# Patient Record
Sex: Female | Born: 1985 | Race: Black or African American | Hispanic: No | Marital: Single | Smoking: Current every day smoker
Health system: Southern US, Community
[De-identification: ages and names within clinical notes are randomized; demographics above are authoritative.]

## PROBLEM LIST (undated history)

## (undated) ENCOUNTER — Inpatient Hospital Stay (HOSPITAL_COMMUNITY): Payer: Self-pay

## (undated) DIAGNOSIS — F32A Depression, unspecified: Secondary | ICD-10-CM

## (undated) DIAGNOSIS — F329 Major depressive disorder, single episode, unspecified: Secondary | ICD-10-CM

## (undated) DIAGNOSIS — F99 Mental disorder, not otherwise specified: Secondary | ICD-10-CM

## (undated) DIAGNOSIS — F1021 Alcohol dependence, in remission: Secondary | ICD-10-CM

## (undated) DIAGNOSIS — J45909 Unspecified asthma, uncomplicated: Secondary | ICD-10-CM

## (undated) DIAGNOSIS — F419 Anxiety disorder, unspecified: Secondary | ICD-10-CM

## (undated) HISTORY — PX: WRIST SURGERY: SHX841

## (undated) HISTORY — DX: Depression, unspecified: F32.A

## (undated) HISTORY — DX: Anxiety disorder, unspecified: F41.9

## (undated) HISTORY — PX: NASAL RECONSTRUCTION: SHX2069

## (undated) HISTORY — DX: Major depressive disorder, single episode, unspecified: F32.9

## (undated) HISTORY — DX: Mental disorder, not otherwise specified: F99

---
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
## 2006-02-13 ENCOUNTER — Emergency Department (HOSPITAL_COMMUNITY): Admission: EM | Admit: 2006-02-13 | Discharge: 2006-02-13 | Payer: Self-pay | Admitting: Emergency Medicine

## 2006-12-13 ENCOUNTER — Inpatient Hospital Stay (HOSPITAL_COMMUNITY): Admission: AD | Admit: 2006-12-13 | Discharge: 2006-12-13 | Payer: Self-pay | Admitting: Obstetrics and Gynecology

## 2006-12-15 ENCOUNTER — Inpatient Hospital Stay (HOSPITAL_COMMUNITY): Admission: AD | Admit: 2006-12-15 | Discharge: 2006-12-19 | Payer: Self-pay | Admitting: Obstetrics and Gynecology

## 2007-03-28 ENCOUNTER — Inpatient Hospital Stay (HOSPITAL_COMMUNITY): Admission: AD | Admit: 2007-03-28 | Discharge: 2007-03-28 | Payer: Self-pay | Admitting: Obstetrics and Gynecology

## 2007-03-31 ENCOUNTER — Emergency Department (HOSPITAL_COMMUNITY): Admission: EM | Admit: 2007-03-31 | Discharge: 2007-03-31 | Payer: Self-pay | Admitting: Emergency Medicine

## 2007-07-01 ENCOUNTER — Emergency Department (HOSPITAL_COMMUNITY): Admission: EM | Admit: 2007-07-01 | Discharge: 2007-07-01 | Payer: Self-pay | Admitting: Emergency Medicine

## 2007-07-07 ENCOUNTER — Emergency Department (HOSPITAL_COMMUNITY): Admission: EM | Admit: 2007-07-07 | Discharge: 2007-07-08 | Payer: Self-pay | Admitting: Emergency Medicine

## 2007-08-30 ENCOUNTER — Emergency Department (HOSPITAL_COMMUNITY): Admission: EM | Admit: 2007-08-30 | Discharge: 2007-08-30 | Payer: Self-pay | Admitting: Emergency Medicine

## 2010-12-06 ENCOUNTER — Emergency Department (HOSPITAL_COMMUNITY): Payer: Medicaid Other

## 2010-12-06 ENCOUNTER — Emergency Department (HOSPITAL_COMMUNITY)
Admission: EM | Admit: 2010-12-06 | Discharge: 2010-12-06 | Disposition: A | Discharge status: Home / Self Care | Payer: Medicaid Other | Attending: Emergency Medicine | Admitting: Emergency Medicine

## 2010-12-06 DIAGNOSIS — I10 Essential (primary) hypertension: Secondary | ICD-10-CM | POA: Insufficient documentation

## 2010-12-06 DIAGNOSIS — J45909 Unspecified asthma, uncomplicated: Secondary | ICD-10-CM | POA: Insufficient documentation

## 2010-12-06 DIAGNOSIS — F1010 Alcohol abuse, uncomplicated: Secondary | ICD-10-CM | POA: Insufficient documentation

## 2010-12-06 DIAGNOSIS — W268XXA Contact with other sharp object(s), not elsewhere classified, initial encounter: Secondary | ICD-10-CM | POA: Insufficient documentation

## 2010-12-06 DIAGNOSIS — S66909A Unspecified injury of unspecified muscle, fascia and tendon at wrist and hand level, unspecified hand, initial encounter: Secondary | ICD-10-CM | POA: Insufficient documentation

## 2010-12-06 DIAGNOSIS — S61509A Unspecified open wound of unspecified wrist, initial encounter: Secondary | ICD-10-CM | POA: Insufficient documentation

## 2010-12-06 DIAGNOSIS — E119 Type 2 diabetes mellitus without complications: Secondary | ICD-10-CM | POA: Insufficient documentation

## 2010-12-12 ENCOUNTER — Ambulatory Visit: Payer: Medicaid Other | Attending: Orthopedic Surgery | Admitting: Occupational Therapy

## 2010-12-12 DIAGNOSIS — IMO0001 Reserved for inherently not codable concepts without codable children: Secondary | ICD-10-CM | POA: Insufficient documentation

## 2010-12-12 DIAGNOSIS — R609 Edema, unspecified: Secondary | ICD-10-CM | POA: Insufficient documentation

## 2010-12-12 DIAGNOSIS — M25549 Pain in joints of unspecified hand: Secondary | ICD-10-CM | POA: Insufficient documentation

## 2010-12-25 ENCOUNTER — Encounter: Payer: Medicaid Other | Admitting: Occupational Therapy

## 2011-03-08 NOTE — Op Note (Signed)
NAME:  Kimberly Rosales, Kimberly Rosales                 ACCOUNT NO.:  [id number]   MEDICAL RECORD NO.:  [id number]          PATIENT TYPE:  INP   LOCATION:  9198                          FACILITY:  [facility]   PHYSICIAN:  Zenaida Niece, M.D.DATE OF BIRTH:  06-27-1986   DATE OF PROCEDURE:  12/15/2006  DATE OF DISCHARGE:                               OPERATIVE REPORT   PREOPERATIVE DIAGNOSIS:  1. Intrauterine pregnancy at 37 weeks.  2. Previous cesarean section x2.  3. In labor.   POSTOPERATIVE DIAGNOSES:  1. Intrauterine pregnancy at 37 weeks.  2. Previous cesarean section x2,  3. In labor.   PROCEDURES:  Repeat low transverse cesarean section.   SURGEON:  Zenaida Niece, M.D.   ASSISTANT:  None.   ANESTHESIA:  Spinal.   SPECIMENS:  Placenta sent to labor and delivery.   ESTIMATED BLOOD LOSS:  800 mL.   COMPLICATIONS:  None.   FINDINGS:  The patient had normal anatomy and delivered a viable female  infant, with Apgars of 9and 9.  Weight 5 pounds 10 ounces.   PROCEDURE IN DETAIL:  The patient was taken to the operating room and  placed in the sitting position.  Dr. Malen Gauze instilled spinal anesthesia  and she was placed in the dorsal supine position with a left lateral  tilt.  Her abdomen was prepped and draped in the usual sterile fashion,  and a Foley catheter was inserted.  The level of her anesthesia was  found to be adequate and her abdomen was entered via a standard  Pfannenstiel incision at the site of her previous scar.  Once the  peritoneal cavity was entered a disposable self-retaining retractor was  placed to expose the lower uterine segment. A 4 cm transverse incision  was made in the lower uterine segment, pushing the bladder inferior.  Membranes were ruptured, revealing clear fluid.  The incision was  extended bilaterally digitally.  The fetal vertex was grasped and  delivered through the incision atraumatically.  A loose nuchal cord x1  was reduced, and mouth and nares were  suctioned.  The remainder of the  infant then delivered atraumatically.  The cord was doubly clamped and  cut, and the infant handed to the awaiting pediatric team.  Cord blood  was obtained and the placenta delivered spontaneously.  Uterus was wiped  dry with a clean lap pad, and all membranes and debris were removed.  Uterine incision was inspected and found to be free of extensions.  Uterine incision was then closed in one layer, being a running, locking  layer with #1 chromic; with adequate hemostasis.  Tubes and ovaries were  inspected and found to be normal.  Uterine incision was again inspected  and found to be hemostatic.  Bleeding from serosal edges was controlled  with electrocautery.  Subfascial space was then irrigated and made  hemostatic with electrocautery.  Fascia was closed in a running fashion,  starting at both ends and meeting in the middle with 0 Vicryl.  Subcutaneous tissue was then irrigated and made hemostatic with  electrocautery.  Skin was then closed with  staples, followed by a  sterile dressing.  The patient tolerated the procedure well and was  taken to the recovery room in stable condition.  Counts were correct x2.  She received Ancef 1 gram IV after cord clamp.      Zenaida Niece, M.D.  Electronically Signed     TDM/MEDQ  D:  12/15/2006  T:  12/15/2006  Job:  [id number]

## 2011-03-08 NOTE — H&P (Signed)
NAME:  Kimberly Rosales, SHIMIZU NO.:  [id number]   MEDICAL RECORD NO.:  [id number]          PATIENT TYPE:  INP   LOCATION:  NA                            [facility]ACILITY:  [facility]   PHYSICIAN:  Huel Cote, M.D. DATE OF BIRTH:  1986/01/16   DATE OF ADMISSION:  06/18/2005  DATE OF DISCHARGE:                                HISTORY & PHYSICAL   The patient is a 25 year old, G2, P1-0-0-1, who is coming in for a scheduled  cesarean section at 39+ weeks gestation.  This is being performed as a  scheduled repeat cesarean section as the patient declined VBAC and has had a  previous cesarean section for failure to progress.  The patient's estimated  due date is June 21, 2005, by an 11 week scan.  She also had a 19 week  anatomy scan.  The patient's prenatal care has been complicated by extremely  scanty prenatal care.  She began her prenatal care in [facility], where  she had approximately 3 obstetrical visits and then was not seen again until  she came to our office at [redacted] weeks gestation.  At that point, the patient  was counseled as to her options regarding cesarean section and VBAC  delivery, and she states that she did not desire a VBAC under any  circumstances.  Her other medical history was significant for sickle cell  trait.  The father of the baby has not been tested, as they have no finances  for that test, and they understand there is a 25% risk of sickle cell anemia  to the baby if he was also a carrier.  She is also a smoker and was advised  to discontinue smoking.  The patient was seen in our office for  approximately 3 visits and was not compliant with her appointments.  In  fact, her previously scheduled cesarean section had to be cancelled because  of her noncompliance with visits and rescheduled until she was seen in the  office one additional time.  At that visit, the patient was normotensive  with a blood pressure of 140/70 and had no complaints.   Her  prenatal labs are as follows:  A positive, antibody negative; Rubella  immune; RPR nonreactive hepatitis B surface antigen negative; GC negative;  Chlamydia negative; HIV negative; she was positive for sickle trait as  stated.  She never had a group B strep performed given her noncompliance  with visits and her intention to not labor vaginally.   PAST MEDICAL HISTORY:  Significant for history of asthma which is stable  with only periodic need of albuterol inhaler.   PAST SURGICAL HISTORY:  C-section in 2005.   PAST OBSTETRICAL HISTORY:  In September 2005, she had a 6 pound 1 ounce  infant by cesarean section at [facility].   PAST GYN HISTORY:  She denies any history of problems with Pap smears or  STDs.   Currently, she is on no medications.  She is a smoker of a pack every 3-4  days.  She denies any drug allergies.   PHYSICAL EXAMINATION:  VITAL SIGNS:  Her weight is 144 pounds, blood  pressure is 140/70.  CARDIAC:  Regular rate and rhythm.  LUNGS:  Clear.  ABDOMEN:  Soft, gravid, and nontender.  Fetal heart rate is normal at 140.  She had a reactive NST prior to surgery on June 14, 2005.  CERVICAL EXAM:  50 and 1 at a -3 and vertex.   The patient was again counseled as to the risks and benefits of proceeding  with cesarean section including bleeding and infection and possible damage  to bowel and bladder.  The patient understands these risks and desires to  proceed with the surgery as stated.  She declines attempt at vaginal birth  after cesarean section and would not labor even if she went into labor prior  to cesarean section.  Her surgery has been rescheduled for Tuesday, the 29th  at noon at [facility] facility.      Huel Cote, M.D.  Electronically Signed     KR/MEDQ  D:  06/14/2005  T:  06/14/2005  Job:  [id number]

## 2011-03-08 NOTE — Discharge Summary (Signed)
Kimberly Rosales, Kimberly Rosales                 ACCOUNT NO.:  [id number]   MEDICAL RECORD NO.:  [id number]          PATIENT TYPE:  INP   LOCATION:  9115                          [facility]ACILITY:  [facility]   PHYSICIAN:  Huel Cote, M.D. DATE OF BIRTH:  Sep 20, 1986   DATE OF ADMISSION:  06/16/2005  DATE OF DISCHARGE:  06/20/2005                                 DISCHARGE SUMMARY   DISCHARGE DIAGNOSES:  1.  Term pregnancy at 39 plus weeks delivered.  2.  Status post repeat low transverse cesarean section   DISCHARGE FOLLOWUP:  The patient is to follow up in the office in two weeks  for an incision check and again in six weeks for a full exam.   DISCHARGE MEDICATIONS:  1.  Motrin 600 mg p.o. over six hours.  2.  Percocet one to two tablets p.o. every four hours p.r.n.   HOSPITAL COURSE:  The patient is a 25 year old G2 P 1-0-0-1 who came in in  labor on June 16, 2005 at 59 plus weeks gestation for a repeat cesarean  section.  Her prenatal care had been remarkable for scanty office visits.  She was seen for probably a total of five to six visits the entire pregnancy  and also the patient was a smoker. She had declined vaginal birth after  cesarean and had been scheduled for cesarean section which had to be  postponed due to her scanty prenatal care. However, came in in labor prior  to the rescheduled date. Prenatal labs are as follows:  A+ antibody  negative, rubella immune, RPR nonreactive, hepatitis B surface antigen  negative, GC negative, chlamydia negative, HIV negative, sickle trait  positive and group B strep had never been done.  For the remainder of her  history and physical, please see her dictated history and physical.   Upon admission, briefly she underwent a repeat low transverse cesarean  section without difficulty and was delivered of a viable female infant, weight  was 5 pounds 11 ounces, Apgars were 8 and 9.  She was then admitted for  routine postoperative care and all was well.  Her  postoperative hemoglobin  remained stable on two checks at 10.8.  On postoperative day #4, she was  doing well, tolerating a regular diet, voiding and having normal flatus. Her  fundus was firm. Her incision was clear. Staples were removed and Steri-  Strips placed and she was felt stable for discharge home. Therefore, she was  discharged with follow-up in two weeks for an incision check.      Huel Cote, M.D.  Electronically Signed     KR/MEDQ  D:  06/20/2005  T:  06/20/2005  Job:  [id number]

## 2011-03-08 NOTE — Discharge Summary (Signed)
NAMELEEONA, MCCARDLE                 ACCOUNT NO.:  [id number]   MEDICAL RECORD NO.:  [id number]          PATIENT TYPE:  INP   LOCATION:  9104                          [facility]ACILITY:  [facility]   PHYSICIAN:  Zenaida Niece, M.D.DATE OF BIRTH:  [birth year]   DATE OF ADMISSION:  12/15/2006  DATE OF DISCHARGE:  12/19/2006                               DISCHARGE SUMMARY   ADMISSION AND DISCHARGE DIAGNOSES:  1. Intrauterine pregnancy at 37 weeks.  2. Previous cesarean section x2.  3. In labor.   PROCEDURES:  On February 25, she had a repeat low transverse cesarean  section.   HISTORY AND PHYSICAL:  This is a 25 year old black female, gravida 3,  para 2-0-0-2, with an EGA of [redacted] weeks, who presents with regular  contractions.  Evaluation in Triage revealed her to be 3-cm dilated with  regular contractions.  Prenatal care was complicated by late care at  approximately 33 weeks and 2 prior cesarean sections, for which she is  scheduled for a repeat cesarean section.  Please see chart for full  history and physical.  History significant for 2 cesarean section,  asthma, sickle cell trait and depression.   PRENATAL LABORATORY DATA:  Blood type is A positive with a negative  antibody screen, rubella immune, hepatitis B surface antigen negative,  RPR nonreactive, HIV negative, gonorrhea and Chlamydia negative.   PHYSICAL EXAM:  Significant for benign abdomen with well-healed  transverse scar and she is gravid with a fundal height appropriate for  gestational age.  Again, cervix per the nurse is 3, 50 and -2.   HOSPITAL COURSE:  The patient was admitted and taken the operating room  and underwent repeat low transverse cesarean section.  She delivered a  viable female infant with Apgars of 9 and 9 that weighed 5 pounds 14  ounces.  Postoperatively, she had no significant complications except  for depression, for which Dr. Senaida Ores started her on Celexa.  Pre-  delivery hemoglobin was 11.5, postdelivery  9.6.  On postoperative #4,  she was felt to be stable enough for discharge home.  Her staples  removed and Steri-Strips applied.  There was a slight overlap of her  incision on the left side and this did slightly separate with removal of  the staples.   DISCHARGE INSTRUCTIONS:  Regular diet, pelvic rest, no strenuous  activity.   FOLLOWUP:  In 2 weeks for incision check.   MEDICATIONS:  1. Darvocet-N 100, #40, one to two p.o. q.4-6 h. p.r.n. pain.  2. Celexa 20 mg p.o. daily.   She was given our discharge pamphlet.      Zenaida Niece, M.D.  Electronically Signed     TDM/MEDQ  D:  12/19/2006  T:  12/19/2006  Job:  [id number]

## 2011-03-08 NOTE — Op Note (Signed)
Kimberly Rosales, Kimberly Rosales                 ACCOUNT NO.:  [id number]   MEDICAL RECORD NO.:  [id number]          PATIENT TYPE:  INP   LOCATION:  9115                          FACILITY:  [facility]   PHYSICIAN:  Huel Cote, M.D. DATE OF BIRTH:  Nov 26, 1985   DATE OF PROCEDURE:  06/16/2005  DATE OF DISCHARGE:                                 OPERATIVE REPORT   PREOPERATIVE DIAGNOSIS:  1.  Term pregnancy at 39+ weeks.  2.  Active labor.  3.  Previous C-section declines vaginal birth after cesarean section.   POSTOPERATIVE DIAGNOSIS:  1.  Term pregnancy at 39+ weeks.  2.  Active labor.  3.  Previous C-section declines vaginal birth after cesarean section.   PROCEDURE:  Repeat low transverse cesarean section with two-layer closure.   SURGEON:  Dr. Huel Cote.   ANESTHESIA:  Spinal.   SPECIMENS SENT:  Placenta.   ESTIMATED BLOOD LOSS:  700 mL.   URINE OUTPUT:  100 mL clear urine.   IV FLUIDS:  2500 mL LR.   FINDINGS:  There is a viable female infant in vertex presentation. Apgars were  08/09 weight 5 pounds 11 ounces. Normal uterus, tubes and ovaries were  noted.   PROCEDURE:  The patient was taken to operating room where spinal anesthesia  was obtained without difficulty. She was then prepped and draped in normal  sterile fashion in dorsal supine position with a leftward tilt. The  Pfannenstiel skin incision was then made through a preexisting scar and  carried through to underlying layer of fascia both by sharp dissection and  Bovie cautery. The fascia was then nicked in the midline and the incision  was extended laterally with Mayo scissors. The inferior aspect of the  incision was then grasped with Kocher clamps, elevated and dissected off the  underlying rectus muscles. Superior aspect was likewise dissected off the  rectus muscles. Rectus muscles were separated in midline and the peritoneal  cavity entered bluntly. Peritoneal incision was extended both superiorly and  inferiorly with careful attention to avoid both bowel  and bladder. The  wound retractor large size was then placed within the incision and utilized  for skin retraction with excellent result. The lower uterine segment was  thus excellently exposed and the lower uterine segment was incised in  transverse fashion. The uterine cavity itself was entered bluntly and  extended digitally. The infant's head was then delivered atraumatically and  bulb suctioned and the remainder of the body delivered without difficulty.  Cord clamped, cut and the infant was handed off to awaiting pediatricians.  The placenta was then delivered after cord blood was obtained and the uterus  cleared of all clots, debris with moist lap sponge. The uterine incision was  then closed in two layers with 0 chromic in a running locked fashion on the  first layer and second layer imbricating suture of the same. The right angle  did require additional suture figure-of-eight for hemostasis of the chromic.  At this point the incision appeared hemostatic with the gutters were cleared  of all clots and debris and any active  bleeding was closely monitored. There  was no bleeding noted. Therefore all instruments and sponges were removed  from the patient's abdomen. The wound retractor was removed. The rectus  muscles were reapproximated with several interrupted sutures of 0 chromic.  The fascia was closed with 0 Vicryl in a running fashion and skin was closed  with staples. Sponge, lap and needle counts were correct x2 and the patient  was taken to the recovery room in stable condition.      Huel Cote, M.D.  Electronically Signed     KR/MEDQ  D:  06/16/2005  T:  06/16/2005  Job:  [id number]

## 2011-07-22 ENCOUNTER — Ambulatory Visit (HOSPITAL_BASED_OUTPATIENT_CLINIC_OR_DEPARTMENT_OTHER)
Admission: RE | Admit: 2011-07-22 | Discharge: 2011-07-22 | Disposition: A | Discharge status: Home / Self Care | Payer: Medicaid Other | Source: Ambulatory Visit | Attending: Orthopedic Surgery | Admitting: Orthopedic Surgery

## 2011-07-22 ENCOUNTER — Other Ambulatory Visit: Payer: Self-pay | Admitting: Orthopedic Surgery

## 2011-07-22 DIAGNOSIS — S5410XA Injury of median nerve at forearm level, unspecified arm, initial encounter: Secondary | ICD-10-CM | POA: Insufficient documentation

## 2011-07-22 DIAGNOSIS — X58XXXA Exposure to other specified factors, initial encounter: Secondary | ICD-10-CM | POA: Insufficient documentation

## 2011-07-22 LAB — POCT HEMOGLOBIN-HEMACUE: Hemoglobin: 13.8 g/dL (ref 12.0–15.0)

## 2011-07-23 NOTE — Op Note (Signed)
  NAME:  Kimberly Rosales, Kimberly Rosales                 ACCOUNT NO.:  [id number]  MEDICAL RECORD NO.:  [id number]  LOCATION:                                 FACILITY:  PHYSICIAN:  Artist Pais. Emani Morad, M.D.DATE OF BIRTH:  1986/01/10  DATE OF PROCEDURE:  07/22/2011 DATE OF DISCHARGE:                              OPERATIVE REPORT   PREOPERATIVE DIAGNOSIS:  Laceration with median nerve repair, right wrist.  POSTOPERATIVE DIAGNOSIS:  Laceration with median nerve repair, right wrist.  PROCEDURE:  Exploration and excision of neuroma in continuity, right median nerve with secondary repair using Axoguard 1-mm nerve sheath wrap.  SURGEON:  Artist Pais. Mina Marble, MD  ASSISTANT:  None.  ANESTHESIA:  General.  TOURNIQUET TIME:  An hour and three minutes.  No complication.  No drains.  The patient was taken to the operating suite.  After induction of general anesthesia, right upper extremity was prepped and draped in sterile fashion.  An Esmarch was used to exsanguinate the limb. Tourniquet was inflated to 250 mmHg.  At this point in time, previous incision was used to get down to the area of the median nerve at the distal forearm and wrist crease.  There was a large neuroma in continuity that was carefully dissected out using loupe magnification. This included extending the wound distally into the carpal canal.  The carpal canal was released.  The median nerve was identified proximally, distally, and traced into a large area where the repair had been undertaken several months prior with a large neuroma in continuity. This was dissected out using the microscope and then carefully excised. There was a very small, less than 1-cm gap that was easily closed primarily with mobilization of nerve proximally and distally in slight flexion of the wrist.  This was undertaken using 9-0 nylon x7 sutures. Once this was done, Axoguard 1-cm wrap was then placed around the median nerve to cover proximal and distal.   The repair site was sutured with 9-0 nylon to encase the nerve under no undue tension.  The wound was then irrigated and loosely closed with 4-0 nylon.  Xeroform, 4 x 4's, and a dorsal extension block splint was applied.  The patient tolerated the procedure well and went to recovery in stable fashion.     Artist Pais Mina Marble, M.D.     MAW/MEDQ  D:  07/22/2011  T:  07/22/2011  Job:  [id number]  Electronically Signed by Dairl Ponder M.D. on 07/23/2011 04:05:38 PM

## 2011-07-30 LAB — POCT PREGNANCY, URINE
Operator id: 151321
Preg Test, Ur: NEGATIVE

## 2011-07-30 LAB — URINALYSIS, ROUTINE W REFLEX MICROSCOPIC
Glucose, UA: NEGATIVE
Specific Gravity, Urine: 1.021
Urobilinogen, UA: 0.2

## 2011-07-30 LAB — URINE MICROSCOPIC-ADD ON

## 2011-08-01 LAB — URINALYSIS, ROUTINE W REFLEX MICROSCOPIC
Glucose, UA: NEGATIVE
Protein, ur: NEGATIVE

## 2011-08-01 LAB — URINE MICROSCOPIC-ADD ON

## 2011-08-08 ENCOUNTER — Encounter: Payer: Medicaid Other | Admitting: Occupational Therapy

## 2011-08-08 LAB — CBC
HCT: 36.8
Platelets: 267
RDW: 14.6 — ABNORMAL HIGH

## 2011-08-08 LAB — DIFFERENTIAL
Basophils Absolute: 0
Eosinophils Relative: 5
Lymphocytes Relative: 19
Neutrophils Relative %: 71

## 2011-08-08 LAB — URINALYSIS, ROUTINE W REFLEX MICROSCOPIC
Bilirubin Urine: NEGATIVE
Bilirubin Urine: NEGATIVE
Ketones, ur: NEGATIVE
Ketones, ur: NEGATIVE
Leukocytes, UA: NEGATIVE
Nitrite: NEGATIVE
Nitrite: NEGATIVE
Protein, ur: NEGATIVE
Protein, ur: 30 — AB
Specific Gravity, Urine: 1.02
Urobilinogen, UA: 0.2
Urobilinogen, UA: 0.2

## 2011-08-08 LAB — URINE CULTURE: Colony Count: 6000

## 2011-08-08 LAB — URINE MICROSCOPIC-ADD ON

## 2011-08-08 LAB — I-STAT 8, (EC8 V) (CONVERTED LAB)
Acid-Base Excess: 1
Bicarbonate: 25.7 — ABNORMAL HIGH
Chloride: 107
HCT: 41
Operator id: 270651
pCO2, Ven: 41 — ABNORMAL LOW

## 2011-08-08 LAB — POCT I-STAT CREATININE: Operator id: 270651

## 2011-08-08 LAB — POCT PREGNANCY, URINE
Operator id: 246641
Preg Test, Ur: NEGATIVE

## 2013-03-05 ENCOUNTER — Telehealth (HOSPITAL_COMMUNITY): Payer: Self-pay | Admitting: *Deleted

## 2013-03-05 ENCOUNTER — Encounter (HOSPITAL_COMMUNITY): Payer: Self-pay | Admitting: *Deleted

## 2013-03-06 ENCOUNTER — Inpatient Hospital Stay (HOSPITAL_COMMUNITY)
Admission: AD | Admit: 2013-03-06 | Discharge: 2013-03-10 | DRG: 897 | Disposition: A | Discharge status: Home / Self Care | Payer: MEDICAID | Attending: Psychiatry | Admitting: Psychiatry

## 2013-03-06 ENCOUNTER — Encounter (HOSPITAL_COMMUNITY): Payer: Self-pay | Admitting: *Deleted

## 2013-03-06 DIAGNOSIS — F1010 Alcohol abuse, uncomplicated: Principal | ICD-10-CM | POA: Diagnosis present

## 2013-03-06 DIAGNOSIS — Z79899 Other long term (current) drug therapy: Secondary | ICD-10-CM

## 2013-03-06 DIAGNOSIS — F39 Unspecified mood [affective] disorder: Secondary | ICD-10-CM | POA: Diagnosis present

## 2013-03-06 DIAGNOSIS — F4310 Post-traumatic stress disorder, unspecified: Secondary | ICD-10-CM | POA: Diagnosis present

## 2013-03-06 MED ORDER — THIAMINE HCL 100 MG/ML IJ SOLN: 100.0000 mg | Freq: Once | INTRAMUSCULAR | Status: DC

## 2013-03-06 MED ORDER — DULOXETINE HCL 60 MG PO CPEP
60.0000 mg | ORAL_CAPSULE | Freq: Every day | ORAL | Status: DC
  Administered 2013-03-06 – 2013-03-10 (×5): 60 mg via ORAL
  Filled 2013-03-06 (×6): qty 1

## 2013-03-06 MED ORDER — ACETAMINOPHEN 325 MG PO TABS
650.0000 mg | ORAL_TABLET | Freq: Four times a day (QID) | ORAL | Status: DC | PRN
  Administered 2013-03-06 (×2): 650 mg via ORAL

## 2013-03-06 MED ORDER — CHLORDIAZEPOXIDE HCL 25 MG PO CAPS
25.0000 mg | ORAL_CAPSULE | Freq: Four times a day (QID) | ORAL | Status: AC | PRN
  Administered 2013-03-06 – 2013-03-07 (×4): 25 mg via ORAL
  Filled 2013-03-06 (×4): qty 1

## 2013-03-06 MED ORDER — ALUM & MAG HYDROXIDE-SIMETH 200-200-20 MG/5ML PO SUSP: 30.0000 mL | ORAL | Status: DC | PRN

## 2013-03-06 MED ORDER — NICOTINE 21 MG/24HR TD PT24
21.0000 mg | MEDICATED_PATCH | Freq: Every day | TRANSDERMAL | Status: DC
  Administered 2013-03-07 – 2013-03-10 (×4): 21 mg via TRANSDERMAL
  Filled 2013-03-06 (×6): qty 1

## 2013-03-06 MED ORDER — LAMOTRIGINE 25 MG PO TABS
25.0000 mg | ORAL_TABLET | Freq: Every day | ORAL | Status: DC
  Administered 2013-03-06 – 2013-03-10 (×5): 25 mg via ORAL
  Filled 2013-03-06 (×8): qty 1

## 2013-03-06 MED ORDER — ONDANSETRON 4 MG PO TBDP: 4.0000 mg | ORAL_TABLET | Freq: Four times a day (QID) | ORAL | Status: AC | PRN

## 2013-03-06 MED ORDER — HYDROXYZINE HCL 25 MG PO TABS
25.0000 mg | ORAL_TABLET | Freq: Four times a day (QID) | ORAL | Status: AC | PRN
  Administered 2013-03-06 – 2013-03-08 (×4): 25 mg via ORAL
  Filled 2013-03-06 (×3): qty 1

## 2013-03-06 MED ORDER — LOPERAMIDE HCL 2 MG PO CAPS: 2.0000 mg | ORAL_CAPSULE | ORAL | Status: AC | PRN

## 2013-03-06 MED ORDER — QUETIAPINE FUMARATE 25 MG PO TABS
25.0000 mg | ORAL_TABLET | Freq: Three times a day (TID) | ORAL | Status: DC | PRN
  Administered 2013-03-06 – 2013-03-07 (×5): 25 mg via ORAL
  Filled 2013-03-06 (×5): qty 1

## 2013-03-06 MED ORDER — TRAZODONE HCL 50 MG PO TABS
50.0000 mg | ORAL_TABLET | Freq: Every evening | ORAL | Status: DC | PRN
  Administered 2013-03-06: 50 mg via ORAL
  Filled 2013-03-06 (×2): qty 1

## 2013-03-06 MED ORDER — CHLORDIAZEPOXIDE HCL 25 MG PO CAPS
ORAL_CAPSULE | ORAL | Status: AC
  Filled 2013-03-06: qty 1

## 2013-03-06 MED ORDER — VITAMIN B-1 100 MG PO TABS
100.0000 mg | ORAL_TABLET | Freq: Every day | ORAL | Status: DC
  Administered 2013-03-06 – 2013-03-10 (×5): 100 mg via ORAL
  Filled 2013-03-06 (×6): qty 1

## 2013-03-06 MED ORDER — CHLORDIAZEPOXIDE HCL 25 MG PO CAPS
25.0000 mg | ORAL_CAPSULE | Freq: Once | ORAL | Status: AC
  Administered 2013-03-06: 25 mg via ORAL

## 2013-03-06 MED ORDER — ADULT MULTIVITAMIN W/MINERALS CH
1.0000 | ORAL_TABLET | Freq: Every day | ORAL | Status: DC
  Administered 2013-03-06 – 2013-03-10 (×5): 1 via ORAL
  Filled 2013-03-06 (×6): qty 1

## 2013-03-06 MED ORDER — MAGNESIUM HYDROXIDE 400 MG/5ML PO SUSP
30.0000 mL | Freq: Every day | ORAL | Status: DC | PRN
  Administered 2013-03-08: 30 mL via ORAL

## 2013-03-06 NOTE — Progress Notes (Signed)
Adult Psychoeducational Group Note  Date:  03/06/2013 Time:  4:10 PM  Group Topic/Focus:  Healthy Communication:   The focus of this group is to discuss communication, barriers to communication, as well as healthy ways to communicate with others.  Participation Level:  Did Not Attend  Participation Quality:    Affect:    Cognitive:    Insight:   Engagement in Group:    Modes of Intervention:    Additional Comments:  Pt was in the bed sleeping, pt refused to attend group.  Isla Pence M 03/06/2013, 4:10 PM

## 2013-03-06 NOTE — Progress Notes (Signed)
Psychoeducational Group Note  Date:  03/06/2013 Time:  1300  Group Topic/Focus:  Identifying Needs:   The focus of this group is to help patients identify their personal needs that have been historically problematic and identify healthy behaviors to address their needs.  Participation Level: Did Not Attend  Participation Quality:  Not Applicable  Affect:  Not Applicable  Cognitive:  Not Applicable  Insight:  Not Applicable  Engagement in Group: Not Applicable  Additional Comments:    Rich Brave 03/06/2013, 3:00 PM
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
## 2013-03-06 NOTE — Tx Team (Signed)
Initial Interdisciplinary Treatment Plan  PATIENT STRENGTHS: (choose at least two) Average or above average intelligence Motivation for treatment/growth Supportive family/friends  PATIENT STRESSORS: Financial difficulties Legal issue Substance abuse   PROBLEM LIST: Problem List/Patient Goals Date to be addressed Date deferred Reason deferred Estimated date of resolution  Depression 03/06/13     Anxiety 03/06/13     Suicidal Ideation 03/06/13                                          DISCHARGE CRITERIA:  Improved stabilization in mood, thinking, and/or behavior Motivation to continue treatment in a less acute level of care Need for constant or close observation no longer present Verbal commitment to aftercare and medication compliance Withdrawal symptoms are absent or subacute and managed without 24-hour nursing intervention  PRELIMINARY DISCHARGE PLAN: Outpatient therapy Return to previous living arrangement  PATIENT/FAMIILY INVOLVEMENT: This treatment plan has been presented to and reviewed with the patient, SARHA BARTELT.  The patient and family have been given the opportunity to ask questions and make suggestions.  Juliann Pares 03/06/2013, 4:26 AM

## 2013-03-06 NOTE — Progress Notes (Signed)
Patient did attend the evening speaker AA meeting.  

## 2013-03-06 NOTE — Progress Notes (Signed)
Psychoeducational Group Note  Date:  03/06/2013 Time:  0945 am  Group Topic/Focus:  Identifying Needs:   The focus of this group is to help patients identify their personal needs that have been historically problematic and identify healthy behaviors to address their needs.   Self inventory  Participation Level:  Active  Participation Quality:  Appropriate  Affect:  Appropriate  Cognitive:  Appropriate  Insight:  Developing/Improving  Engagement in Group:  Developing/Improving  Additional Comments:    Andrena Mews 03/06/2013,10:52 AM
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
## 2013-03-06 NOTE — Progress Notes (Signed)
D   Pt complains of anxiety but not related to withdrawal   She did attend group  She has a tendency to isolate and has limited interaction with others  She reported this morning that she was very depressed and hopes the doctor will put her on and antidepressant A   Verbal support given  Medications administered and effectiveness monitored   Q 15 min checks R   Pt safe at present

## 2013-03-07 MED ORDER — IBUPROFEN 800 MG PO TABS
800.0000 mg | ORAL_TABLET | Freq: Three times a day (TID) | ORAL | Status: DC | PRN
  Administered 2013-03-07 – 2013-03-10 (×6): 800 mg via ORAL
  Filled 2013-03-07 (×6): qty 1

## 2013-03-07 MED ORDER — TRAZODONE HCL 100 MG PO TABS
100.0000 mg | ORAL_TABLET | Freq: Every evening | ORAL | Status: DC | PRN
  Administered 2013-03-07 – 2013-03-08 (×4): 100 mg via ORAL
  Filled 2013-03-07: qty 1
  Filled 2013-03-07: qty 28
  Filled 2013-03-07 (×3): qty 1

## 2013-03-07 MED ORDER — GABAPENTIN 100 MG PO CAPS
100.0000 mg | ORAL_CAPSULE | Freq: Three times a day (TID) | ORAL | Status: DC
  Administered 2013-03-07 – 2013-03-08 (×3): 100 mg via ORAL
  Filled 2013-03-07 (×7): qty 1

## 2013-03-07 NOTE — Progress Notes (Signed)
D.  Pt anxious on approach, denies other complaints at this time.  Pt did not attend evening AA group, stayed in room in bed.  Denies SI/HI/hallucinations at this time.  Interacting appropriately with peers on the unit.  A.  Support and encouragement offered, medication given as ordered for anxiety and insomnia.  R.  Pt remains safe on unit, will continue to monitor.

## 2013-03-07 NOTE — Progress Notes (Signed)
Patient did not attend the evening speaker AA meeting. Pt remained in bed after being notified that meeting was beginning.

## 2013-03-07 NOTE — [facility] Group Notes (Addendum)
[facility] Group Notes:  (Clinical Social Work)  03/07/2013  10:00-11:00AM  Summary of Progress/Problems:   The main focus of today's process group was to   identify the patient's current support system and decide on other supports that can be put in place.  Four definitions/levels of support were discussed and an exercise was utilized to show how much stronger we become with additional supports.  An emphasis was placed on using counselor, doctor, therapy groups, 12-step groups, and problem-specific support groups to expand supports, as well as doing something different than has been done before. The patient expressed a willingness to add some other supports, but is not quite sure yet what, and said she needs to think about it.  For one thing, the person whom she now turns to for support is the person who gives her pills.  She has not decided what to do about that.  She has a hard time trusting people, but did acknowledge that she has started trusting people in the hospital after a short while, and that some therapeutic exchanges take place without trust being needed.  Type of Therapy:  Process Group with Motivational Interviewing  Participation Level:  Active  Participation Quality:  Appropriate, Attentive and Sharing  Affect:  Blunted  Cognitive:  Alert, Appropriate and Oriented  Insight:  Engaged  Engagement in Therapy:  Engaged  Modes of Intervention:   Education, Support and Processing, Activity  [employer], LCSW 03/07/2013, 9:28 AM

## 2013-03-07 NOTE — Progress Notes (Signed)
[facility] MD Progress Note  03/07/2013 3:42 PM HYE TRAWICK  MRN:  [id number] Subjective: Admits she is still feeling very anxious, depressed. States she has a hard time being comfortable around people, she is afraid of the dark, does better when she can keep things in order at her house (compulsive about cleaning). She is concerned as she was very dependent on alcohol to function. Has a hard time seeing herself as able to function without it Diagnosis:  Alcohol Dependence/PTSD,MDD  ADL's:  Intact  Sleep: Poor  Appetite:  Fair  Suicidal Ideation:  Plan:  denies Intent:  denies Means:  denies Homicidal Ideation:  Plan:  denies Intent:  denies Means:  denies AEB (as evidenced by):  Psychiatric Specialty Exam: Review of Systems  Constitutional: Negative.   HENT: Negative.   Eyes: Negative.   Respiratory: Negative.   Cardiovascular: Negative.   Gastrointestinal: Negative.   Genitourinary: Negative.   Musculoskeletal: Positive for joint pain.  Skin: Negative.   Neurological: Negative.   Endo/Heme/Allergies: Negative.   Psychiatric/Behavioral: Positive for depression and substance abuse. The patient is nervous/anxious and has insomnia.     Blood pressure 118/77, pulse 100, temperature 98 F (36.7 C), temperature source Oral, resp. rate 18, height 5\' 3"  (1.6 m), weight 62.596 kg (138 lb), last menstrual period 02/27/2013.Body mass index is 24.45 kg/(m^2).  General Appearance: Fairly Groomed  Patent attorney::  Fair  Speech:  Clear and Coherent, Slow and not spontaneous  Volume:  Decreased  Mood:  Depressed  Affect:  Restricted  Thought Process:  Coherent and Goal Directed  Orientation:  Full (Time, Place, and Person)  Thought Content:  Rumination  Suicidal Thoughts:  No  Homicidal Thoughts:  No  Memory:  Immediate;   Fair Recent;   Fair Remote;   Fair  Judgement:  Fair  Insight:  Present  Psychomotor Activity:  Restlessness  Concentration:  Fair  Recall:  Fair  Akathisia:   No  Handed:  Right  AIMS (if indicated):     Assets:  Desire for Improvement  Sleep:  Number of Hours: 6.5   Current Medications: Current Facility-Administered Medications  Medication Dose Route Frequency Provider Last Rate Last Dose  . acetaminophen (TYLENOL) tablet 650 mg  650 mg Oral Q6H PRN Court Joy, PA-C   650 mg at 03/06/13 1741  . alum & mag hydroxide-simeth (MAALOX/MYLANTA) 200-200-20 MG/5ML suspension 30 mL  30 mL Oral Q4H PRN Court Joy, PA-C      . chlordiazePOXIDE (LIBRIUM) capsule 25 mg  25 mg Oral Q6H PRN Court Joy, PA-C   25 mg at 03/07/13 1457  . DULoxetine (CYMBALTA) DR capsule 60 mg  60 mg Oral Daily Rachael Fee, MD   60 mg at 03/07/13 [phone]  . gabapentin (NEURONTIN) capsule 100 mg  100 mg Oral TID Rachael Fee, MD   100 mg at 03/07/13 1249  . hydrOXYzine (ATARAX/VISTARIL) tablet 25 mg  25 mg Oral Q6H PRN Court Joy, PA-C   25 mg at 03/07/13 1124  . ibuprofen (ADVIL,MOTRIN) tablet 800 mg  800 mg Oral Q8H PRN Rachael Fee, MD   800 mg at 03/07/13 1249  . lamoTRIgine (LAMICTAL) tablet 25 mg  25 mg Oral Daily Rachael Fee, MD   25 mg at 03/07/13 [phone]  . loperamide (IMODIUM) capsule 2-4 mg  2-4 mg Oral PRN Court Joy, PA-C      . magnesium hydroxide (MILK OF MAGNESIA) suspension 30 mL  30 mL  Oral Daily PRN Court Joy, PA-C      . multivitamin with minerals tablet 1 tablet  1 tablet Oral Daily Court Joy, PA-C   1 tablet at 03/07/13 [phone]  . nicotine (NICODERM CQ - dosed in mg/24 hours) patch 21 mg  21 mg Transdermal Q0600 Rachael Fee, MD   21 mg at 03/07/13 0606  . ondansetron (ZOFRAN-ODT) disintegrating tablet 4 mg  4 mg Oral Q6H PRN Court Joy, PA-C      . QUEtiapine (SEROQUEL) tablet 25 mg  25 mg Oral TID PRN Rachael Fee, MD   25 mg at 03/07/13 1457  . thiamine (B-1) injection 100 mg  100 mg Intramuscular Once Court Joy, PA-C      . thiamine (VITAMIN B-1) tablet 100 mg  100 mg Oral Daily Court Joy, PA-C   100 mg at  03/07/13 8657  . traZODone (DESYREL) tablet 100 mg  100 mg Oral QHS PRN,MR X 1 Rachael Fee, MD        Lab Results: No results found for this or any previous visit (from the past 48 hour(s)).  Physical Findings: AIMS: Facial and Oral Movements Muscles of Facial Expression: None, normal Lips and Perioral Area: None, normal Jaw: None, normal Tongue: None, normal,Extremity Movements Upper (arms, wrists, hands, fingers): None, normal Lower (legs, knees, ankles, toes): None, normal, Trunk Movements Neck, shoulders, hips: None, normal, Overall Severity Severity of abnormal movements (highest score from questions above): None, normal Incapacitation due to abnormal movements: None, normal Patient's awareness of abnormal movements (rate only patient's report): No Awareness, Dental Status Current problems with teeth and/or dentures?: No Does patient usually wear dentures?: No  CIWA:  CIWA-Ar Total: 4 COWS:     Treatment Plan Summary: Daily contact with patient to assess and evaluate symptoms and progress in treatment Medication management  Plan: Supportive approach/coping skills/relapse prevention           Continue to optimize treatment with psychotropics  Medical Decision Making Problem Points:  Review of last therapy session (1) and Review of psycho-social stressors (1) Data Points:  Review of medication regiment & side effects (2) Review of new medications or change in dosage (2)  I certify that inpatient services furnished can reasonably be expected to improve the patient's condition.   Arisbeth Purrington A 03/07/2013, 3:42 PM

## 2013-03-07 NOTE — Progress Notes (Signed)
Adult Psychoeducational Group Note  Date:  03/07/2013 Time:  3:29 PM  Group Topic/Focus:  Conflict Resolution:   The focus of this group is to discuss the conflict resolution process and how it may be used upon discharge.  Participation Level:  Active  Participation Quality:  Appropriate, Sharing and Supportive  Affect:  Appropriate and Depressed  Cognitive:  Appropriate  Insight: Appropriate  Engagement in Group:  Engaged and Supportive  Modes of Intervention:  Discussion, Education and Support  Additional Comments:  Pt was appropriate, pt stated that the a phone call with children father made her depressed, pt was tearful.  Isla Pence M 03/07/2013, 3:29 PM
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
## 2013-03-07 NOTE — Progress Notes (Signed)
Psychoeducational Group Note  Date: 03/07/2013 Time: 1300  Group Topic/Focus:  Making Healthy Choices:   The focus of this group is to help patients identify negative/unhealthy choices they were using prior to admission and identify positive/healthier coping strategies to replace them upon discharge.  Participation Level:  Active  Participation Quality:  Appropriate  Affect:  Appropriate  Cognitive:  Alert  Insight:  Engaged  Engagement in Group:  Engaged  Additional Comments:    03/07/2013,3:02 PM Tarita Deshmukh, Joie Bimler

## 2013-03-07 NOTE — Progress Notes (Signed)
Psychoeducational Group Note  Date:  03/07/2013 Time:  0945 am  Group Topic/Focus:  Making Healthy Choices:   The focus of this group is to help patients identify negative/unhealthy choices they were using prior to admission and identify positive/healthier coping strategies to replace them upon discharge.  Participation Level:  Active  Participation Quality:  Appropriate  Affect:  Appropriate and Depressed  Cognitive:  Alert and Appropriate  Insight:  Developing/Improving  Engagement in Group:  Developing/Improving  Additional Comments:    Andrena Mews 03/07/2013, 10:29 AM

## 2013-03-07 NOTE — Progress Notes (Signed)
D   Pt is depressed and sad   She reports her depression has not lessened yet   She also admits to some irritability   She attended and participated in groups   Her insight is starting to develop and her understanding of the illness   She expressed the desire to get better for her children A   Verbal support given   Medications administered and effectiveness monitored   Q 15 min checks R   Pt safe at present

## 2013-03-08 DIAGNOSIS — F329 Major depressive disorder, single episode, unspecified: Secondary | ICD-10-CM

## 2013-03-08 DIAGNOSIS — F1110 Opioid abuse, uncomplicated: Secondary | ICD-10-CM

## 2013-03-08 DIAGNOSIS — F1020 Alcohol dependence, uncomplicated: Secondary | ICD-10-CM

## 2013-03-08 DIAGNOSIS — F4310 Post-traumatic stress disorder, unspecified: Secondary | ICD-10-CM

## 2013-03-08 MED ORDER — LIDOCAINE 5 % EX PTCH
1.0000 | MEDICATED_PATCH | Freq: Every day | CUTANEOUS | Status: DC
  Filled 2013-03-08 (×2): qty 1

## 2013-03-08 MED ORDER — TOPIRAMATE 25 MG PO TABS
25.0000 mg | ORAL_TABLET | Freq: Two times a day (BID) | ORAL | Status: DC
  Administered 2013-03-08 – 2013-03-10 (×4): 25 mg via ORAL
  Filled 2013-03-08 (×8): qty 1

## 2013-03-08 MED ORDER — FLUCONAZOLE 150 MG PO TABS
150.0000 mg | ORAL_TABLET | Freq: Once | ORAL | Status: AC
  Administered 2013-03-08: 150 mg via ORAL
  Filled 2013-03-08: qty 1

## 2013-03-08 MED ORDER — GABAPENTIN 100 MG PO CAPS
200.0000 mg | ORAL_CAPSULE | Freq: Three times a day (TID) | ORAL | Status: DC
  Administered 2013-03-08 – 2013-03-09 (×4): 200 mg via ORAL
  Filled 2013-03-08 (×9): qty 2

## 2013-03-08 MED ORDER — QUETIAPINE FUMARATE 50 MG PO TABS
50.0000 mg | ORAL_TABLET | Freq: Three times a day (TID) | ORAL | Status: DC
  Administered 2013-03-08 – 2013-03-10 (×5): 50 mg via ORAL
  Filled 2013-03-08 (×8): qty 1

## 2013-03-08 MED ORDER — QUETIAPINE FUMARATE 50 MG PO TABS
50.0000 mg | ORAL_TABLET | Freq: Once | ORAL | Status: AC
  Administered 2013-03-08: 50 mg via ORAL
  Filled 2013-03-08: qty 1

## 2013-03-08 MED ORDER — LIDOCAINE 5 % EX PTCH
1.0000 | MEDICATED_PATCH | CUTANEOUS | Status: DC
  Administered 2013-03-08: 1 via TRANSDERMAL
  Filled 2013-03-08 (×2): qty 1

## 2013-03-08 MED ORDER — METHOCARBAMOL 500 MG PO TABS
500.0000 mg | ORAL_TABLET | Freq: Three times a day (TID) | ORAL | Status: DC
  Administered 2013-03-08 – 2013-03-10 (×6): 500 mg via ORAL
  Filled 2013-03-08 (×9): qty 1

## 2013-03-08 MED ORDER — LIDOCAINE 5 % EX PTCH
1.0000 | MEDICATED_PATCH | CUTANEOUS | Status: DC
  Filled 2013-03-08: qty 1

## 2013-03-08 NOTE — Progress Notes (Signed)
Psychoeducational Group Note  Date:  03/08/2013 Time:  1000  Group Topic/Focus:  Wellness Toolbox:   The focus of this group is to discuss various aspects of wellness, balancing those aspects and exploring ways to increase the ability to experience wellness.  Patients will create a wellness toolbox for use upon discharge.  Participation Level: Did Not Attend  Participation Quality:  Not Applicable  Affect:  Not Applicable  Cognitive:  Not Applicable  Insight:  Not Applicable  Engagement in Group: Not Applicable  Additional Comments:  Pt remained resting in bed while attending group. Staff communicated to pt that it was time for group, pt did not respond.  Sharyn Lull 03/08/2013, 10:43 AM
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
## 2013-03-08 NOTE — [facility] Group Notes (Signed)
[facility] LCSW Group Therapy  03/08/2013 1:15 PM  Type of Therapy:  Group Therapy  Participation Level:  Active  Participation Quality:  Appropriate  Affect:  Depressed  Cognitive:  Alert and Oriented  Insight:  None shared  Engagement in Therapy:  Improving  Modes of Intervention:  Clarification, Exploration, Problem-solving, Socialization and Support  Summary of Progress/Problems:  Group discussion focused on what patient's see as their own obstacles to recovery.  Patient shares belief that life would be difficult to deal with without my alcohol. "If you let me out of here today I will go home to drink today.  I drink every morning and throughout the day, I quit using THC because my kids said 'mommy uses drugs' now they want me to stop drinking."  Patient spoke multiple times about "it is my only happiness"; "I drink to feel better and there is noting better than alcohol and music."  Patient unable to identify anything unhealthy about the children not being able to spend time out doors or seeing her consume alcohol on daily basis.   Clide Dales

## 2013-03-08 NOTE — Tx Team (Signed)
Interdisciplinary Treatment Plan Update (Adult)  Date: 03/08/2013  Time Reviewed: 9:42 AM   Progress in Treatment: Attending groups: Some Participating in groups: Some Taking medication as prescribed:  Yes Tolerating medication:  Yes Family/Significant othe contact made: Not as yet Patient understands diagnosis: Yes Discussing patient identified problems/goals with staff: Yes Medical problems stabilized or resolved:  Yes Denies suicidal/homicidal ideation: Yes Patient has not harmed self or Others: Yes  New problem(s) identified: None Identified  Discharge Plan or Barriers:  CSW is assessing for appropriate referrals.   Additional comments: N/A  Reason for Continuation of Hospitalization Anxiety Depression Medication stabilization Suicidal ideation Withdrawal symptom  Estimated length of stay: 5 days  For review of initial/current patient goals, please see plan of care.  Attendees: Patient:     Family:     Physician:  Geoffery Lyons 03/08/2013 9:42 AM   Nursing:   Liborio Nixon, RN 03/08/2013 9:42 AM   Clinical Social Worker Ronda Fairly 03/08/2013 9:42 AM   Other:  Liliane Bade, Community Care Coordinator 03/08/2013 9:42 AM   Other:  Estell Harpin, Community Care Coordinator  03/08/2013 9:42 AM   Other:  Caroll Rancher, MHT 03/08/2013 9:42 AM   Other:  Hadassah Pais, RN  03/08/2013 9:42 AM    Scribe for Treatment Team:   Carney Bern, LCSWA  03/08/2013 9:42 AM

## 2013-03-08 NOTE — [facility] Group Notes (Signed)
  [facility] LCSW Aftercare Discharge Planning Group Note   03/08/2013 8:45 AM  Participation Quality:  Did not attend; CSW met with patient 1 to 1  Mood/Affect:  Depressed and Flat  Depression Rating:  10  Anxiety Rating:  10  Thoughts of Suicide:  Yes Will you contract for safety?   Yes  Current AVH:  No  Plan for Discharge/Comments:  Uncertain, CSW assessing for SA inpatient referrals  Transportation Means: Uncertain  Supports: Sister  Kimberly Rosales

## 2013-03-08 NOTE — Progress Notes (Signed)
[facility] MD Progress Note  03/08/2013 12:33 PM Kimberly Rosales  MRN:  [id number] Subjective:  Kimberly Rosales endorses she is feeling extremely depressed, suicidal. States she understands she used to numb herself with alcohol and pain pills, and now she does not have the drugs and she is dealing with the pain, of the trauma. She admits to a lot of agitation. Did not sleep too well last night. She is able to contract for safety. She has gone to the nurse early today feeling she was going to hurt herself. Endorsing increased back pain, muscle spasms Diagnosis:  Major Depression, PTSD, Alcohol Dependence, Opioid Abuse  ADL's:  Intact  Sleep: Poor  Appetite:  Poor  Suicidal Ideation:  Plan:  denies Intent:  denies Means:  denies Homicidal Ideation:  Plan:  denies Intent:  denies Means:  denies AEB (as evidenced by):  Psychiatric Specialty Exam: Review of Systems  Constitutional: Negative.   HENT: Negative.   Eyes: Negative.   Respiratory: Negative.   Cardiovascular: Negative.   Gastrointestinal: Negative.   Genitourinary: Negative.   Musculoskeletal: Positive for myalgias and back pain.  Skin: Negative.   Neurological: Negative.   Endo/Heme/Allergies: Negative.   Psychiatric/Behavioral: Positive for depression, suicidal ideas and substance abuse. The patient is nervous/anxious and has insomnia.     Blood pressure 123/66, pulse 114, temperature 98.1 F (36.7 C), temperature source Oral, resp. rate 18, height 5\' 3"  (1.6 m), weight 62.596 kg (138 lb), last menstrual period 02/27/2013.Body mass index is 24.45 kg/(m^2).  General Appearance: Fairly Groomed  Patent attorney::  Minimal  Speech:  Clear and Coherent, Slow and not spontaneous  Volume:  Decreased  Mood:  Anxious, Depressed and Hopeless  Affect:  Restricted, becomes tearful  Thought Process:  Coherent and Goal Directed  Orientation:  Full (Time, Place, and Person)  Thought Content:  worries, concerns, not sure if she is going to be able to  make it  Suicidal Thoughts:  Yes.  without intent/plan  Homicidal Thoughts:  No  Memory:  Immediate;   Fair Recent;   Fair Remote;   Fair  Judgement:  Fair  Insight:  Present  Psychomotor Activity:  Restlessness  Concentration:  Fair  Recall:  Fair  Akathisia:  No  Handed:  Right  AIMS (if indicated):     Assets:  Desire for Improvement  Sleep:  Number of Hours: 5.5   Current Medications: Current Facility-Administered Medications  Medication Dose Route Frequency Provider Last Rate Last Dose  . acetaminophen (TYLENOL) tablet 650 mg  650 mg Oral Q6H PRN Court Joy, PA-C   650 mg at 03/06/13 1741  . alum & mag hydroxide-simeth (MAALOX/MYLANTA) 200-200-20 MG/5ML suspension 30 mL  30 mL Oral Q4H PRN Court Joy, PA-C      . chlordiazePOXIDE (LIBRIUM) capsule 25 mg  25 mg Oral Q6H PRN Court Joy, PA-C   25 mg at 03/07/13 2151  . DULoxetine (CYMBALTA) DR capsule 60 mg  60 mg Oral Daily Rachael Fee, MD   60 mg at 03/08/13 0848  . gabapentin (NEURONTIN) capsule 200 mg  200 mg Oral TID Rachael Fee, MD      . hydrOXYzine (ATARAX/VISTARIL) tablet 25 mg  25 mg Oral Q6H PRN Court Joy, PA-C   25 mg at 03/07/13 1124  . ibuprofen (ADVIL,MOTRIN) tablet 800 mg  800 mg Oral Q8H PRN Rachael Fee, MD   800 mg at 03/07/13 2151  . lamoTRIgine (LAMICTAL) tablet 25 mg  25 mg  Oral Daily Rachael Fee, MD   25 mg at 03/08/13 0848  . lidocaine (LIDODERM) 5 % 1 patch  1 patch Transdermal Q24H Rachael Fee, MD      . Melene Muller ON 03/09/2013] lidocaine (LIDODERM) 5 % 1 patch  1 patch Transdermal Daily Rachael Fee, MD      . loperamide (IMODIUM) capsule 2-4 mg  2-4 mg Oral PRN Court Joy, PA-C      . magnesium hydroxide (MILK OF MAGNESIA) suspension 30 mL  30 mL Oral Daily PRN Court Joy, PA-C      . methocarbamol (ROBAXIN) tablet 500 mg  500 mg Oral TID Rachael Fee, MD      . multivitamin with minerals tablet 1 tablet  1 tablet Oral Daily Court Joy, PA-C   1 tablet at  03/08/13 0848  . nicotine (NICODERM CQ - dosed in mg/24 hours) patch 21 mg  21 mg Transdermal Q0600 Rachael Fee, MD   21 mg at 03/08/13 0608  . ondansetron (ZOFRAN-ODT) disintegrating tablet 4 mg  4 mg Oral Q6H PRN Court Joy, PA-C      . QUEtiapine (SEROQUEL) tablet 50 mg  50 mg Oral TID Rachael Fee, MD      . QUEtiapine (SEROQUEL) tablet 50 mg  50 mg Oral Once Rachael Fee, MD      . thiamine (B-1) injection 100 mg  100 mg Intramuscular Once Court Joy, PA-C      . thiamine (VITAMIN B-1) tablet 100 mg  100 mg Oral Daily Court Joy, PA-C   100 mg at 03/08/13 0848  . traZODone (DESYREL) tablet 100 mg  100 mg Oral QHS PRN,MR X 1 Rachael Fee, MD   100 mg at 03/07/13 2303    Lab Results: No results found for this or any previous visit (from the past 48 hour(s)).  Physical Findings: AIMS: Facial and Oral Movements Muscles of Facial Expression: None, normal Lips and Perioral Area: None, normal Jaw: None, normal Tongue: None, normal,Extremity Movements Upper (arms, wrists, hands, fingers): None, normal Lower (legs, knees, ankles, toes): None, normal, Trunk Movements Neck, shoulders, hips: None, normal, Overall Severity Severity of abnormal movements (highest score from questions above): None, normal Incapacitation due to abnormal movements: None, normal Patient's awareness of abnormal movements (rate only patient's report): No Awareness, Dental Status Current problems with teeth and/or dentures?: No Does patient usually wear dentures?: No  CIWA:  CIWA-Ar Total: 3 COWS:     Treatment Plan Summary: Daily contact with patient to assess and evaluate symptoms and progress in treatment Medication management  Plan: Supportive approach/coping skills/relapse prevention           Increase the Seroquel to 50 mg TID           Increase the Neurontin 200 mg TID           Robaxin 500 mg TID           Lidoderm patch to back  Medical Decision Making Problem Points:  Review of  last therapy session (1) and Review of psycho-social stressors (1) Data Points:  Review of medication regiment & side effects (2) Review of new medications or change in dosage (2)  I certify that inpatient services furnished can reasonably be expected to improve the patient's condition.   Talene Glastetter A 03/08/2013, 12:33 PM

## 2013-03-09 MED ORDER — LIDOCAINE 5 % EX PTCH
1.0000 | MEDICATED_PATCH | CUTANEOUS | Status: DC
  Administered 2013-03-09: 1 via TRANSDERMAL
  Filled 2013-03-09 (×2): qty 1

## 2013-03-09 MED ORDER — BISACODYL 10 MG RE SUPP
10.0000 mg | Freq: Once | RECTAL | Status: AC
  Administered 2013-03-09: 10 mg via RECTAL
  Filled 2013-03-09: qty 1

## 2013-03-09 MED ORDER — RAMELTEON 8 MG PO TABS
8.0000 mg | ORAL_TABLET | Freq: Every day | ORAL | Status: DC
  Administered 2013-03-09: 8 mg via ORAL
  Filled 2013-03-09 (×2): qty 1

## 2013-03-09 MED ORDER — MAGNESIUM CITRATE PO SOLN
1.0000 | Freq: Once | ORAL | Status: AC
  Administered 2013-03-09: 1 via ORAL

## 2013-03-09 MED ORDER — GABAPENTIN 300 MG PO CAPS
300.0000 mg | ORAL_CAPSULE | Freq: Three times a day (TID) | ORAL | Status: DC
  Administered 2013-03-09 – 2013-03-10 (×2): 300 mg via ORAL
  Filled 2013-03-09 (×6): qty 1

## 2013-03-09 NOTE — Progress Notes (Signed)
Patient ID: Kimberly Rosales, female   DOB: Feb 19, 1986, 27 y.o.   MRN: [id number]  D:  Pt approached the nurses station and asked for "something for anxiety". Writer observed pt's leg shaking. Writer informed pt that she didn't have any prn meds available, because all her meds were scheduled. Writer spoke to pt about her kids and some of the advice she'd give them when they're anxious. Informed pt that there's not enough meds for her to take every time she's going to feel anxious in the future. Writer and pt spoke about personal experiences. Pt acknowledged understanding and felt she could better put "things" into perspective. Pt stated, "my leg isn't shaking any more", and smiled.   A:  Support and encouragement was offered. 15 min checks continued for safety.  R: Pt remains safe.

## 2013-03-09 NOTE — Progress Notes (Signed)
Pt's has c/o of constipation and was given mag citrate and dulcolax suppository which she had positive results.  She is suppose to discharge in the morning to [facility].  Pt still reporting her depression a 6 hopelessness a 5 and her anxiety a 10 on her self-inventory.  She denies any S/H ideation or A/V hallucinations.  She denied any symptoms of withdrawal.

## 2013-03-09 NOTE — Progress Notes (Signed)
Recreation Therapy Notes  Date: 05.20.2014 Time: 3:00pm Location: [address] Dayroom      Group Topic/Focus: Goal Setting, Team Building  Participation Level: Did not attend  Boy Delamater L Usama Harkless, LRT/CTRS  Marcelo Ickes L 03/09/2013 5:29 PM 
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
## 2013-03-09 NOTE — Progress Notes (Signed)
Patient ID: Kimberly Rosales, female   DOB: 09/03/1986, 27 y.o.   MRN: [id number] Patient accepted at [facility] for transfer on 5/21. CSW has called [facility] for Authorization number, waiting for call back at 2:16 PM 03/09/2013. [facility] UR called back at 2:16 for assessment and UR received at 3:01 PM Authorization [id number].   [facility] was called with AUTH number and they would like to pick up patient by 10:30 AM on Wednesday 03/10/13  Carney Bern, LCSWA

## 2013-03-09 NOTE — [facility] Group Notes (Signed)
[facility] LCSW Aftercare Discharge Planning Group Note   03/09/2013  8:45 PM  Participation Quality:  Appropriate  Mood/Affect:  Anxious  Depression Rating:  6  Anxiety Rating:  10  Thoughts of Suicide:  No Will you contract for safety?   NA  Current AVH:  No  Plan for Discharge/Comments:  Patient invested and anxious to discharge to [facility]; not willing to go to [facility]  Transportation Means:  [facility] or perhaps father  Supports: Family and children  Dyane Dustman, Julious Payer

## 2013-03-09 NOTE — Progress Notes (Signed)
Pt attended AA group this evening.  

## 2013-03-09 NOTE — [facility] Group Notes (Signed)
Adult Psychoeducational Group Note  Date:  03/09/2013 Time:  1100am  Group Topic/Focus:  Recovery Goals:   The focus of this group is to identify appropriate goals for recovery and establish a plan to achieve them.  Participation Level:  Active  Participation Quality:  Appropriate  Affect:  Appropriate  Cognitive:  Appropriate  Insight: Appropriate  Engagement in Group:  Engaged  Modes of Intervention:  Education  Additional Comments: Staff explained to the patient that the purpose of this group is to educate them on recovery, and on setting mid-range to long-term personal goals for their recovery process.  Staff also explained that the group will also address topics including what recovery is, who goes through recovery, the first steps toward recovery, and setting realistic goals for recovery. Patient was asked to identify two areas of their life in which they would like to make a change toward recovery, and set a specific measurable goal to address each change identified. Patient was provided with a homework assignment regarding how this goal impacts their personal recovery. Staff concluded the group by encouraging the patient to take a proactive approach to accomplishing their recovery goals.   Ardelle Park O 03/09/2013, 3:22 PM

## 2013-03-09 NOTE — Progress Notes (Signed)
[facility] MD Progress Note  03/09/2013 5:49 PM KHLOE HUNKELE  MRN:  [id number] Subjective:  Shay states that she is not sleeping well. Her mood has been improving and she feels a little hopeful that things are going to get better for her. Still endorses and anxiety. States that her MD has prescribed Ativan for States she is not doing this just for her, but also for her kids. Main concern is her constipation, he anxiety and insomnia Diagnosis:  PTSD, Mood Disorder NOS  ADL's:  Intact  Sleep: Poor  Appetite:  Fair  Suicidal Ideation:  Plan:  denies Intent:  denies Means:  denies Homicidal Ideation:  Plan:  denies Intent:  denies Means:  denies AEB (as evidenced by):  Psychiatric Specialty Exam: Review of Systems  Constitutional: Negative.   HENT: Negative.   Eyes: Negative.   Respiratory: Negative.   Cardiovascular: Negative.   Gastrointestinal: Positive for constipation.  Genitourinary: Negative.   Musculoskeletal: Negative.   Skin: Negative.   Neurological: Negative.   Endo/Heme/Allergies: Negative.   Psychiatric/Behavioral: Positive for depression and substance abuse. The patient is nervous/anxious and has insomnia.     Blood pressure 107/67, pulse 101, temperature 98.1 F (36.7 C), temperature source Oral, resp. rate 16, height 5\' 3"  (1.6 m), weight 62.596 kg (138 lb), last menstrual period 02/27/2013.Body mass index is 24.45 kg/(m^2).  General Appearance: Fairly Groomed  Patent attorney::  Fair  Speech:  Clear and Coherent  Volume:  Decreased  Mood:  Anxious and worried  Affect:  Appropriate  Thought Process:  Coherent and Goal Directed  Orientation:  Full (Time, Place, and Person)  Thought Content:  worries, concerns  Suicidal Thoughts:  No  Homicidal Thoughts:  No  Memory:  Immediate;   Fair Recent;   Fair Remote;   Fair  Judgement:  Fair  Insight:  Present  Psychomotor Activity:  Restlessness  Concentration:  Fair  Recall:  Fair  Akathisia:  No  Handed:  Right   AIMS (if indicated):     Assets:  Desire for Improvement  Sleep:  Number of Hours: 6.25   Current Medications: Current Facility-Administered Medications  Medication Dose Route Frequency Provider Last Rate Last Dose  . acetaminophen (TYLENOL) tablet 650 mg  650 mg Oral Q6H PRN Court Joy, PA-C   650 mg at 03/06/13 1741  . alum & mag hydroxide-simeth (MAALOX/MYLANTA) 200-200-20 MG/5ML suspension 30 mL  30 mL Oral Q4H PRN Court Joy, PA-C      . DULoxetine (CYMBALTA) DR capsule 60 mg  60 mg Oral Daily Rachael Fee, MD   60 mg at 03/09/13 0803  . gabapentin (NEURONTIN) capsule 300 mg  300 mg Oral TID Rachael Fee, MD   300 mg at 03/09/13 1714  . ibuprofen (ADVIL,MOTRIN) tablet 800 mg  800 mg Oral Q8H PRN Rachael Fee, MD   800 mg at 03/09/13 1356  . lamoTRIgine (LAMICTAL) tablet 25 mg  25 mg Oral Daily Rachael Fee, MD   25 mg at 03/09/13 0803  . lidocaine (LIDODERM) 5 % 1 patch  1 patch Transdermal Q24H Kerry Hough, PA-C   1 patch at 03/09/13 1715  . magnesium hydroxide (MILK OF MAGNESIA) suspension 30 mL  30 mL Oral Daily PRN Court Joy, PA-C   30 mL at 03/08/13 1955  . methocarbamol (ROBAXIN) tablet 500 mg  500 mg Oral TID Rachael Fee, MD   500 mg at 03/09/13 1714  . multivitamin with minerals tablet  1 tablet  1 tablet Oral Daily Court Joy, PA-C   1 tablet at 03/09/13 1610  . nicotine (NICODERM CQ - dosed in mg/24 hours) patch 21 mg  21 mg Transdermal Q0600 Rachael Fee, MD   21 mg at 03/09/13 [phone]  . QUEtiapine (SEROQUEL) tablet 50 mg  50 mg Oral TID Rachael Fee, MD   50 mg at 03/09/13 1714  . ramelteon (ROZEREM) tablet 8 mg  8 mg Oral QHS Rachael Fee, MD      . thiamine (B-1) injection 100 mg  100 mg Intramuscular Once Court Joy, PA-C      . thiamine (VITAMIN B-1) tablet 100 mg  100 mg Oral Daily Court Joy, PA-C   100 mg at 03/09/13 0804  . topiramate (TOPAMAX) tablet 25 mg  25 mg Oral BID Rachael Fee, MD   25 mg at 03/09/13 1714  .  traZODone (DESYREL) tablet 100 mg  100 mg Oral QHS PRN,MR X 1 Rachael Fee, MD   100 mg at 03/08/13 2240    Lab Results: No results found for this or any previous visit (from the past 48 hour(s)).  Physical Findings: AIMS: Facial and Oral Movements Muscles of Facial Expression: None, normal Lips and Perioral Area: None, normal Jaw: None, normal Tongue: None, normal,Extremity Movements Upper (arms, wrists, hands, fingers): None, normal Lower (legs, knees, ankles, toes): None, normal, Trunk Movements Neck, shoulders, hips: None, normal, Overall Severity Severity of abnormal movements (highest score from questions above): None, normal Incapacitation due to abnormal movements: None, normal Patient's awareness of abnormal movements (rate only patient's report): No Awareness, Dental Status Current problems with teeth and/or dentures?: No Does patient usually wear dentures?: No  CIWA:  CIWA-Ar Total: 4 COWS:     Treatment Plan Summary: Daily contact with patient to assess and evaluate symptoms and progress in treatment Medication management  Plan: Supportive approach/coping skills/relapse prevention           Will use Magnesium Citrate for the constipation           Will increase the Neurontin to 300 mg TID for the anxiety-cravings           Rozerem 8 mg for sleep  Medical Decision Making Problem Points:  Review of last therapy session (1) and Review of psycho-social stressors (1) Data Points:  Review of medication regiment & side effects (2) Review of new medications or change in dosage (2)  I certify that inpatient services furnished can reasonably be expected to improve the patient's condition.   Karista Aispuro A 03/09/2013, 5:49 PM

## 2013-03-09 NOTE — Progress Notes (Signed)
Adult Psychoeducational Group Note  Date:  03/09/2013 Time:  10:35 AM  Group Topic/Focus:  Therapeutic Activity- "Fact or Crap"  Participation Level:  Active  Participation Quality:  Appropriate, Attentive and Sharing  Affect:  Appropriate  Cognitive:  Appropriate  Insight: Appropriate  Engagement in Group:  Engaged  Modes of Intervention:  Activity  Additional Comments:  Pt was appropriate and willing to play the game fact or crap. Pt appeared pleasant by laughing when the answer was right.   Sharyn Lull 03/09/2013, 10:35 AM

## 2013-03-09 NOTE — [facility] Group Notes (Signed)
[facility] LCSW Group Therapy  03/09/2013  1:15 PM  Type of Therapy:  Group Therapy  Participation Level:  Active  Participation Quality:  Appropriate and Attentive  Affect:  Appropriate  Cognitive:  Appropriate  Insight:  Developing/Improving  Engagement in Therapy:  Developing/Improving  Modes of Intervention:  Discussion, Education, Socialization and Support  Summary of Progress/Problems: Patient attended group presentation by staff member of  [employer] ([employer]).   Kimberly Rosales was attentive and appropriate during session.  She expressed gratitude to speaker and shared that she felt hopeful.   Kimberly Rosales

## 2013-03-10 MED ORDER — RAMELTEON 8 MG PO TABS: 8.0000 mg | ORAL_TABLET | Freq: Every day | ORAL | Status: AC

## 2013-03-10 MED ORDER — GABAPENTIN 300 MG PO CAPS: 300.0000 mg | ORAL_CAPSULE | Freq: Three times a day (TID) | ORAL | Status: AC

## 2013-03-10 MED ORDER — METHOCARBAMOL 500 MG PO TABS: 500.0000 mg | ORAL_TABLET | Freq: Three times a day (TID) | ORAL | Status: DC

## 2013-03-10 MED ORDER — TRAZODONE HCL 100 MG PO TABS: 100.0000 mg | ORAL_TABLET | Freq: Every evening | ORAL | Status: AC | PRN

## 2013-03-10 MED ORDER — QUETIAPINE FUMARATE 50 MG PO TABS: 50.0000 mg | ORAL_TABLET | Freq: Three times a day (TID) | ORAL | Status: AC

## 2013-03-10 MED ORDER — LAMOTRIGINE 25 MG PO TABS: 25.0000 mg | ORAL_TABLET | Freq: Every day | ORAL | Status: AC

## 2013-03-10 MED ORDER — TOPIRAMATE 25 MG PO TABS: 25.0000 mg | ORAL_TABLET | Freq: Two times a day (BID) | ORAL | Status: AC

## 2013-03-10 MED ORDER — LIDOCAINE 5 % EX PTCH: 1.0000 | MEDICATED_PATCH | CUTANEOUS | Status: AC

## 2013-03-10 MED ORDER — DULOXETINE HCL 60 MG PO CPEP: 60.0000 mg | ORAL_CAPSULE | Freq: Every day | ORAL | Status: AC

## 2013-03-10 NOTE — Progress Notes (Signed)
[facility] Adult Case Management Discharge Plan :  Will you be returning to the same living situation after discharge: No. Patient discharging to [facility] At discharge, do you have transportation home?:Yes,  [facility] to pick up Do you have the ability to pay for your medications:Yes,  through [facility] &/or [facility]   Release of information consent forms completed and in the chart;  Patient's signature needed at discharge.  Patient to Follow up at: Follow-up Information   Follow up with [facility] On 03/10/2013. ([facility] will pick patient up today at 10:30)    Contact information:   [address] [place], [place] [address] Ph [phone] Valinda Hoar [phone]      Patient denies SI/HI:   Yes,  denies both    Safety Planning and Suicide Prevention discussed:  No. Patient discharging to another medical facility  Clide Dales 03/10/2013, 8:24 AM

## 2013-03-10 NOTE — B[facility] Suicide Risk Assessment (Signed)
[facility] INPATIENT:  Family/Significant Other Suicide Prevention Education  Suicide Prevention Education:  Patient Discharged to Other Healthcare Facility:  Suicide Prevention Education Not Provided: {PT. DISCHARGED TO OTHER HEALTHCARE FACILITY:SUICIDE PREVENTION EDUCATION NOT PROVIDED ([facility]):  The patient is discharging to another healthcare facility for continuation of treatment.  The patient's medical information, including suicide ideations and risk factors, are a part of the medical information shared with the receiving healthcare facility.  Clide Dales 03/10/2013, 8:23 AM

## 2013-03-10 NOTE — Progress Notes (Signed)
Pt was discharged to [facility] today.  She denied any S/I H/I or A/V hallucinations.    She was given f/u appointment, rx, sample medications, hotline info booklet.  She voiced understanding to all instructions provided.  She declined the need for smoking cessation materials.  She removed her nicotine patch before she left.
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
## 2013-03-12 NOTE — Progress Notes (Signed)
Patient Discharge Instructions:  After Visit Summary (AVS):   Faxed to:  03/12/13 Discharge Summary Note:   Faxed to:  03/12/13 Psychiatric Admission Assessment Note:   Faxed to:  03/12/13 Suicide Risk Assessment - Discharge Assessment:   Faxed to:  03/12/13 Faxed/Sent to the Next Level Care provider:  03/12/13 Faxed to [facility] @ [phone]  Jerelene Redden, 03/12/2013, 3:45 PM

## 2015-04-19 ENCOUNTER — Inpatient Hospital Stay (HOSPITAL_COMMUNITY): Payer: Medicaid Other

## 2015-04-19 ENCOUNTER — Inpatient Hospital Stay (HOSPITAL_COMMUNITY)
Admission: AD | Admit: 2015-04-19 | Discharge: 2015-04-19 | Disposition: A | Discharge status: Home / Self Care | Payer: Medicaid Other | Source: Ambulatory Visit | Attending: Obstetrics & Gynecology | Admitting: Obstetrics & Gynecology

## 2015-04-19 ENCOUNTER — Encounter (HOSPITAL_COMMUNITY): Payer: Self-pay | Admitting: *Deleted

## 2015-04-19 DIAGNOSIS — R109 Unspecified abdominal pain: Secondary | ICD-10-CM | POA: Insufficient documentation

## 2015-04-19 DIAGNOSIS — F17210 Nicotine dependence, cigarettes, uncomplicated: Secondary | ICD-10-CM | POA: Diagnosis not present

## 2015-04-19 DIAGNOSIS — O9989 Other specified diseases and conditions complicating pregnancy, childbirth and the puerperium: Secondary | ICD-10-CM | POA: Diagnosis not present

## 2015-04-19 DIAGNOSIS — O26899 Other specified pregnancy related conditions, unspecified trimester: Secondary | ICD-10-CM

## 2015-04-19 DIAGNOSIS — Z3A00 Weeks of gestation of pregnancy not specified: Secondary | ICD-10-CM | POA: Insufficient documentation

## 2015-04-19 DIAGNOSIS — O99331 Smoking (tobacco) complicating pregnancy, first trimester: Secondary | ICD-10-CM | POA: Diagnosis not present

## 2015-04-19 DIAGNOSIS — Z3491 Encounter for supervision of normal pregnancy, unspecified, first trimester: Secondary | ICD-10-CM

## 2015-04-19 HISTORY — DX: Unspecified asthma, uncomplicated: J45.909

## 2015-04-19 HISTORY — DX: Alcohol dependence, in remission: F10.21

## 2015-04-19 LAB — URINALYSIS, ROUTINE W REFLEX MICROSCOPIC
BILIRUBIN URINE: NEGATIVE
Glucose, UA: NEGATIVE mg/dL
Hgb urine dipstick: NEGATIVE
Ketones, ur: NEGATIVE mg/dL
LEUKOCYTES UA: NEGATIVE
NITRITE: NEGATIVE
PH: 5.5 (ref 5.0–8.0)
Protein, ur: NEGATIVE mg/dL
SPECIFIC GRAVITY, URINE: 1.025 (ref 1.005–1.030)
Urobilinogen, UA: 0.2 mg/dL (ref 0.0–1.0)

## 2015-04-19 LAB — CBC
HEMATOCRIT: 35.1 % — AB (ref 36.0–46.0)
HEMOGLOBIN: 12.4 g/dL (ref 12.0–15.0)
MCH: 27.9 pg (ref 26.0–34.0)
MCHC: 35.3 g/dL (ref 30.0–36.0)
MCV: 79.1 fL (ref 78.0–100.0)
Platelets: 260 10*3/uL (ref 150–400)
RBC: 4.44 MIL/uL (ref 3.87–5.11)
RDW: 14.9 % (ref 11.5–15.5)
WBC: 5.6 10*3/uL (ref 4.0–10.5)

## 2015-04-19 LAB — POCT PREGNANCY, URINE: PREG TEST UR: POSITIVE — AB

## 2015-04-19 LAB — WET PREP, GENITAL
Trich, Wet Prep: NONE SEEN
YEAST WET PREP: NONE SEEN

## 2015-04-19 LAB — ABO/RH: ABO/RH(D): A POS

## 2015-04-19 LAB — HCG, QUANTITATIVE, PREGNANCY: HCG, BETA CHAIN, QUANT, S: 30533 m[IU]/mL — AB (ref <5)

## 2015-04-19 NOTE — MAU Provider Note (Signed)
History     CSN: [id number]  Arrival date and time: 04/19/15 1021   First Provider Initiated Contact with Patient 04/19/15 1153      Chief Complaint  Patient presents with  . +HPT   . Abdominal Pain  . Fatigue  . Dizziness   HPI   [name] is a 29 y.o. female [phone] at Unknown presenting to MAU for confirmation of pregnancy. She is unsure when her last cycle was; she feels she has gained quite a bit of weight; she had a positive pregnancy test last night.  She is concerned because she is having abdominal pain just below her umbilicus; right where her c-section scar is. She started having pain last night; the pain is constant. The pain started in her back and has radiated to her belly button. She has not taken anything for the pain.   She denies vagina bleeding.     OB History    Gravida Para Term Preterm AB TAB SAB Ectopic Multiple Living   Past Medical History  Diagnosis Date  . Anxiety   . Mental disorder   . Depression   . Alcoholism in remission   . Asthma     Past Surgical History  Procedure Laterality Date  . Cesarean section    . Nasal reconstruction    . Wrist surgery      nerve and tendon repair    History reviewed. No pertinent family history.  History  Substance Use Topics  . Smoking status: Current Every Day Smoker -- 0.50 packs/day    Types: Cigarettes  . Smokeless tobacco: Not on file  . Alcohol Use: No    Allergies: No Known Allergies  Prescriptions prior to admission  Medication Sig Dispense Refill Last Dose  . escitalopram (LEXAPRO) 20 MG tablet Take 20 mg by mouth daily.   04/19/2015 at Unknown time  . ziprasidone (GEODON) 60 MG capsule Take 60 mg by mouth daily.   04/19/2015 at Unknown time  . DULoxetine (CYMBALTA) 60 MG capsule Take 1 capsule (60 mg total) by mouth daily. For depression (Patient not taking: Reported on 04/19/2015) 30 capsule 0   . gabapentin (NEURONTIN) 300 MG capsule Take 1 capsule (300  mg total) by mouth 3 (three) times daily. For anxiety issues (Patient not taking: Reported on 04/19/2015) 90 capsule 0   . lamoTRIgine (LAMICTAL) 25 MG tablet Take 1 tablet (25 mg total) by mouth daily. For mood stabilization (Patient not taking: Reported on 04/19/2015) 30 tablet 0   . lidocaine (LIDODERM) 5 % Place 1 patch onto the skin daily. Remove & Discard patch within 12 hours or as directed by MD: For pain management (Patient not taking: Reported on 04/19/2015) 5 patch 0   . QUEtiapine (SEROQUEL) 50 MG tablet Take 1 tablet (50 mg total) by mouth 3 (three) times daily. For mood control/anxiety (Patient not taking: Reported on 04/19/2015) 90 tablet 0   . ramelteon (ROZEREM) 8 MG tablet Take 1 tablet (8 mg total) by mouth at bedtime. For sleep (Patient not taking: Reported on 04/19/2015) 30 tablet 0   . topiramate (TOPAMAX) 25 MG tablet Take 1 tablet (25 mg total) by mouth 2 (two) times daily. For mood stabilization (Patient not taking: Reported on 04/19/2015) 60 tablet 0   . traZODone (DESYREL) 100 MG tablet Take 1 tablet (100 mg total) by mouth at bedtime as needed and may repeat dose one  time if needed for sleep. (Patient not taking: Reported on 04/19/2015) 60 tablet 0     Results for orders placed or performed during the hospital encounter of 04/19/15 (from the past 48 hour(s))  Urinalysis, Routine w reflex microscopic (not at [facility])     Status: Abnormal   Collection Time: 04/19/15 11:10 AM  Result Value Ref Range   Color, Urine YELLOW YELLOW   APPearance HAZY (A) CLEAR   Specific Gravity, Urine 1.025 1.005 - 1.030   pH 5.5 5.0 - 8.0   Glucose, UA NEGATIVE NEGATIVE mg/dL   Hgb urine dipstick NEGATIVE NEGATIVE   Bilirubin Urine NEGATIVE NEGATIVE   Ketones, ur NEGATIVE NEGATIVE mg/dL   Protein, ur NEGATIVE NEGATIVE mg/dL   Urobilinogen, UA 0.2 0.0 - 1.0 mg/dL   Nitrite NEGATIVE NEGATIVE   Leukocytes, UA NEGATIVE NEGATIVE    Comment: MICROSCOPIC NOT DONE ON URINES WITH NEGATIVE PROTEIN, BLOOD,  LEUKOCYTES, NITRITE, OR GLUCOSE <1000 mg/dL.  Pregnancy, urine POC     Status: Abnormal   Collection Time: 04/19/15 11:18 AM  Result Value Ref Range   Preg Test, Ur POSITIVE (A) NEGATIVE    Comment:        THE SENSITIVITY OF THIS METHODOLOGY IS >24 mIU/mL   Wet prep, genital     Status: Abnormal   Collection Time: 04/19/15 12:06 PM  Result Value Ref Range   Yeast Wet Prep HPF POC NONE SEEN NONE SEEN   Trich, Wet Prep NONE SEEN NONE SEEN   Clue Cells Wet Prep HPF POC FEW (A) NONE SEEN   WBC, Wet Prep HPF POC FEW (A) NONE SEEN    Comment: MODERATE BACTERIA SEEN  hCG, quantitative, pregnancy     Status: Abnormal   Collection Time: 04/19/15 12:16 PM  Result Value Ref Range   hCG, Beta Chain, Quant, S 30533 (H) <5 mIU/mL    Comment:          GEST. AGE      CONC.  (mIU/mL)   <=1 WEEK        5 - 50     2 WEEKS       50 - 500     3 WEEKS       100 - 10,000     4 WEEKS     1,000 - 30,000     5 WEEKS     3,500 - 115,000   6-8 WEEKS     12,000 - 270,000    12 WEEKS     15,000 - 220,000        FEMALE AND NON-PREGNANT FEMALE:     LESS THAN 5 mIU/mL   CBC     Status: Abnormal   Collection Time: 04/19/15 12:17 PM  Result Value Ref Range   WBC 5.6 4.0 - 10.5 K/uL   RBC 4.44 3.87 - 5.11 MIL/uL   Hemoglobin 12.4 12.0 - 15.0 g/dL   HCT 16.135.1 (L) 09.636.0 - 04.546.0 %   MCV 79.1 78.0 - 100.0 fL   MCH 27.9 26.0 - 34.0 pg   MCHC 35.3 30.0 - 36.0 g/dL   RDW 40.914.9 81.111.5 - 91.415.5 %   Platelets 260 150 - 400 K/uL  ABO/Rh     Status: None (Preliminary result)   Collection Time: 04/19/15 12:17 PM  Result Value Ref Range   ABO/RH(D) A POS    [place] Ob Comp Less 14 Wks  04/19/2015   CLINICAL DATA:  Pain for 1 day.  EXAM: OBSTETRIC <14 WK  [place] AND TRANSVAGINAL OB US  TECHNIQUE: Both transabdominal and transvaginal ultrasound examinations were performed for complete evaluation of the gestation as well as the maternal uterus, adnexal regions, and pelvic cul-de-sac. Transvaginal technique was performed to assess early  pregnancy.  COMPARISON:  05/28/2014  FINDINGS: Intrauterine gestational sac: Visualized/normal in shape.  Yolk sac:  Present  Embryo:  Present  Cardiac Activity: Present  Heart Rate: 117  bpm  CRL:  4.3  mm   6 w   1 d                  [place] EDC: 12/12/2015  Maternal uterus/adnexae: Normal appearance of the left ovary. Question a small subchorionic hemorrhage. There is a mildly complex follicle or a corpus luteum cyst measuring up to 1.9 cm in the right ovary. No free fluid.  IMPRESSION: Single live intrauterine pregnancy. Calculated gestational age by ultrasound is 6 weeks and 1 day.   Electronically Signed   By: Richarda Overlie M.D.   On: 04/19/2015 13:49   US Ob Transvaginal  04/19/2015   CLINICAL DATA:  Pain for 1 day.  EXAM: OBSTETRIC <14 WK [place] AND TRANSVAGINAL OB US  TECHNIQUE: Both transabdominal and transvaginal ultrasound examinations were performed for complete evaluation of the gestation as well as the maternal uterus, adnexal regions, and pelvic cul-de-sac. Transvaginal technique was performed to assess early pregnancy.  COMPARISON:  05/28/2014  FINDINGS: Intrauterine gestational sac: Visualized/normal in shape.  Yolk sac:  Present  Embryo:  Present  Cardiac Activity: Present  Heart Rate: 117  bpm  CRL:  4.3  mm   6 w   1 d                  [place] EDC: 12/12/2015  Maternal uterus/adnexae: Normal appearance of the left ovary. Question a small subchorionic hemorrhage. There is a mildly complex follicle or a corpus luteum cyst measuring up to 1.9 cm in the right ovary. No free fluid.  IMPRESSION: Single live intrauterine pregnancy. Calculated gestational age by ultrasound is 6 weeks and 1 day.   Electronically Signed   By: Richarda Overlie M.D.   On: 04/19/2015 13:49    Review of Systems  Constitutional: Negative for fever.  Gastrointestinal: Positive for nausea and abdominal pain (Just under her belly button ). Negative for vomiting.   Physical Exam   Blood pressure 117/56, pulse 84, temperature 98.1 F (36.7  C), temperature source Oral, resp. rate 16, height 5\' 3"  (1.6 m), weight 69.854 kg (154 lb), last menstrual period 03/18/2015.  Physical Exam  Constitutional: She is oriented to person, place, and time. She appears well-developed and well-nourished. No distress.  HENT:  Head: Normocephalic.  Eyes: Pupils are equal, round, and reactive to light.  GI: Soft. There is tenderness in the periumbilical area, suprapubic area and left lower quadrant. There is no rigidity, no rebound and no guarding.  Genitourinary:  Speculum exam: Vagina - Small amount of creamy, white discharge, no odor Cervix - No contact bleeding Bimanual exam: Cervix closed Uterus non tender; Gravid  Adnexa non tender, no masses bilaterally GC/Chlam, wet prep done Chaperone present for exam.  Musculoskeletal: Normal range of motion.  Neurological: She is alert and oriented to person, place, and time.  Skin: Skin is warm. She is not diaphoretic.  Psychiatric: Her behavior is normal.    MAU Course  Procedures  none  MDM  Wet prep GC HIV  [place]  CBC HCG  Assessment and Plan  A:  1. Normal intrauterine pregnancy on prenatal ultrasound, first trimester   2. Abdominal pain in pregnancy, antepartum     P:  Discharge home in stable condition A list of safe medications to use in pregnancy given Start prenatal care ASAP First trimester warning signs discussed     Duane Lope, NP 04/19/2015 11:59 AM

## 2015-04-19 NOTE — MAU Note (Signed)
Patient presents following a +HPT last night with c/o abdominal pain since yesterday; dizziness and generalized weakness since this morning. Denies bleeding or discharge.

## 2015-04-19 NOTE — Discharge Instructions (Signed)

## 2015-04-20 LAB — GC/CHLAMYDIA PROBE AMP (~~LOC~~) NOT AT ARMC
Chlamydia: NEGATIVE
NEISSERIA GONORRHEA: NEGATIVE

## 2015-04-20 LAB — HIV ANTIBODY (ROUTINE TESTING W REFLEX): HIV SCREEN 4TH GENERATION: NONREACTIVE

## 2015-05-17 ENCOUNTER — Inpatient Hospital Stay (HOSPITAL_COMMUNITY)
Admission: AD | Admit: 2015-05-17 | Discharge: 2015-05-17 | Disposition: A | Discharge status: Home / Self Care | Payer: Medicaid Other | Source: Ambulatory Visit | Attending: Obstetrics and Gynecology | Admitting: Obstetrics and Gynecology

## 2015-05-17 ENCOUNTER — Inpatient Hospital Stay (HOSPITAL_COMMUNITY): Payer: Medicaid Other

## 2015-05-17 ENCOUNTER — Encounter (HOSPITAL_COMMUNITY): Payer: Self-pay | Admitting: *Deleted

## 2015-05-17 DIAGNOSIS — F17210 Nicotine dependence, cigarettes, uncomplicated: Secondary | ICD-10-CM | POA: Insufficient documentation

## 2015-05-17 DIAGNOSIS — O210 Mild hyperemesis gravidarum: Secondary | ICD-10-CM | POA: Insufficient documentation

## 2015-05-17 DIAGNOSIS — O99340 Other mental disorders complicating pregnancy, unspecified trimester: Secondary | ICD-10-CM | POA: Insufficient documentation

## 2015-05-17 DIAGNOSIS — R45851 Suicidal ideations: Secondary | ICD-10-CM

## 2015-05-17 DIAGNOSIS — O99331 Smoking (tobacco) complicating pregnancy, first trimester: Secondary | ICD-10-CM | POA: Insufficient documentation

## 2015-05-17 DIAGNOSIS — O219 Vomiting of pregnancy, unspecified: Secondary | ICD-10-CM

## 2015-05-17 DIAGNOSIS — O3680X0 Pregnancy with inconclusive fetal viability, not applicable or unspecified: Secondary | ICD-10-CM

## 2015-05-17 DIAGNOSIS — Z3A10 10 weeks gestation of pregnancy: Secondary | ICD-10-CM | POA: Insufficient documentation

## 2015-05-17 DIAGNOSIS — R197 Diarrhea, unspecified: Secondary | ICD-10-CM | POA: Diagnosis present

## 2015-05-17 DIAGNOSIS — Z8759 Personal history of other complications of pregnancy, childbirth and the puerperium: Secondary | ICD-10-CM

## 2015-05-17 LAB — URINALYSIS, ROUTINE W REFLEX MICROSCOPIC
BILIRUBIN URINE: NEGATIVE
Glucose, UA: NEGATIVE mg/dL
HGB URINE DIPSTICK: NEGATIVE
Ketones, ur: NEGATIVE mg/dL
Leukocytes, UA: NEGATIVE
Nitrite: NEGATIVE
Protein, ur: NEGATIVE mg/dL
Specific Gravity, Urine: >1.03 — ABNORMAL HIGH (ref 1.005–1.030)
Urobilinogen, UA: 0.2 mg/dL (ref 0.0–1.0)
pH: 5.5 (ref 5.0–8.0)

## 2015-05-17 LAB — CBC WITH DIFFERENTIAL/PLATELET
BASOS ABS: 0 10*3/uL (ref 0.0–0.1)
BASOS PCT: 0 % (ref 0–1)
Eosinophils Absolute: 0.2 10*3/uL (ref 0.0–0.7)
Eosinophils Relative: 3 % (ref 0–5)
HEMATOCRIT: 36.2 % (ref 36.0–46.0)
HEMOGLOBIN: 12.6 g/dL (ref 12.0–15.0)
LYMPHS PCT: 16 % (ref 12–46)
Lymphs Abs: 1.2 10*3/uL (ref 0.7–4.0)
MCH: 27.8 pg (ref 26.0–34.0)
MCHC: 34.8 g/dL (ref 30.0–36.0)
MCV: 79.7 fL (ref 78.0–100.0)
MONO ABS: 0.5 10*3/uL (ref 0.1–1.0)
MONOS PCT: 7 % (ref 3–12)
NEUTROS ABS: 5.3 10*3/uL (ref 1.7–7.7)
Neutrophils Relative %: 74 % (ref 43–77)
OTHER: 0 %
PLATELETS: 258 10*3/uL (ref 150–400)
RBC: 4.54 MIL/uL (ref 3.87–5.11)
RDW: 14.8 % (ref 11.5–15.5)
WBC: 7.2 10*3/uL (ref 4.0–10.5)

## 2015-05-17 MED ORDER — ONDANSETRON 8 MG PO TBDP
8.0000 mg | ORAL_TABLET | Freq: Once | ORAL | Status: AC
  Administered 2015-05-17: 8 mg via ORAL
  Filled 2015-05-17: qty 1

## 2015-05-17 MED ORDER — PROMETHAZINE HCL 25 MG PO TABS
25.0000 mg | ORAL_TABLET | Freq: Once | ORAL | Status: AC
  Administered 2015-05-17: 25 mg via ORAL
  Filled 2015-05-17: qty 1

## 2015-05-17 MED ORDER — METOCLOPRAMIDE HCL 10 MG PO TABS
10.0000 mg | ORAL_TABLET | Freq: Once | ORAL | Status: AC
  Administered 2015-05-17: 10 mg via ORAL
  Filled 2015-05-17: qty 1

## 2015-05-17 MED ORDER — PROMETHAZINE HCL 25 MG PO TABS: 12.5000 mg | ORAL_TABLET | Freq: Four times a day (QID) | ORAL | Status: AC | PRN

## 2015-05-17 NOTE — Discharge Instructions (Signed)
First Trimester of Pregnancy The first trimester of pregnancy is from week 1 until the end of week 12 (months 1 through 3). A week after a sperm fertilizes an egg, the egg will implant on the wall of the uterus. This embryo will begin to develop into a baby. Genes from you and your partner are forming the baby. The female genes determine whether the baby is a boy or a girl. At 6-8 weeks, the eyes and face are formed, and the heartbeat can be seen on ultrasound. At the end of 12 weeks, all the baby's organs are formed.  Now that you are pregnant, you will want to do everything you can to have a healthy baby. Two of the most important things are to get good prenatal care and to follow your health care provider's instructions. Prenatal care is all the medical care you receive before the baby's birth. This care will help prevent, find, and treat any problems during the pregnancy and childbirth. BODY CHANGES Your body goes through many changes during pregnancy. The changes vary from woman to woman.   You may gain or lose a couple of pounds at first.  You may feel sick to your stomach (nauseous) and throw up (vomit). If the vomiting is uncontrollable, call your health care provider.  You may tire easily.  You may develop headaches that can be relieved by medicines approved by your health care provider.  You may urinate more often. Painful urination may mean you have a bladder infection.  You may develop heartburn as a result of your pregnancy.  You may develop constipation because certain hormones are causing the muscles that push waste through your intestines to slow down.  You may develop hemorrhoids or swollen, bulging veins (varicose veins).  Your breasts may begin to grow larger and become tender. Your nipples may stick out more, and the tissue that surrounds them (areola) may become darker.  Your gums may bleed and may be sensitive to brushing and flossing.  Dark spots or blotches (chloasma,  mask of pregnancy) may develop on your face. This will likely fade after the baby is born.  Your menstrual periods will stop.  You may have a loss of appetite.  You may develop cravings for certain kinds of food.  You may have changes in your emotions from day to day, such as being excited to be pregnant or being concerned that something may go wrong with the pregnancy and baby.  You may have more vivid and strange dreams.  You may have changes in your hair. These can include thickening of your hair, rapid growth, and changes in texture. Some women also have hair loss during or after pregnancy, or hair that feels dry or thin. Your hair will most likely return to normal after your baby is born. WHAT TO EXPECT AT YOUR PRENATAL VISITS During a routine prenatal visit:  You will be weighed to make sure you and the baby are growing normally.  Your blood pressure will be taken.  Your abdomen will be measured to track your baby's growth.  The fetal heartbeat will be listened to starting around week 10 or 12 of your pregnancy.  Test results from any previous visits will be discussed. Your health care provider may ask you:  How you are feeling.  If you are feeling the baby move.  If you have had any abnormal symptoms, such as leaking fluid, bleeding, severe headaches, or abdominal cramping.  If you have any questions. Other tests   that may be performed during your first trimester include:  Blood tests to find your blood type and to check for the presence of any previous infections. They will also be used to check for low iron levels (anemia) and Rh antibodies. Later in the pregnancy, blood tests for diabetes will be done along with other tests if problems develop.  Urine tests to check for infections, diabetes, or protein in the urine.  An ultrasound to confirm the proper growth and development of the baby.  An amniocentesis to check for possible genetic problems.  Fetal screens for  spina bifida and Down syndrome.  You may need other tests to make sure you and the baby are doing well. HOME CARE INSTRUCTIONS  Medicines  Follow your health care provider's instructions regarding medicine use. Specific medicines may be either safe or unsafe to take during pregnancy.  Take your prenatal vitamins as directed.  If you develop constipation, try taking a stool softener if your health care provider approves. Diet  Eat regular, well-balanced meals. Choose a variety of foods, such as meat or vegetable-based protein, fish, milk and low-fat dairy products, vegetables, fruits, and whole grain breads and cereals. Your health care provider will help you determine the amount of weight gain that is right for you.  Avoid raw meat and uncooked cheese. These carry germs that can cause birth defects in the baby.  Eating four or five small meals rather than three large meals a day may help relieve nausea and vomiting. If you start to feel nauseous, eating a few soda crackers can be helpful. Drinking liquids between meals instead of during meals also seems to help nausea and vomiting.  If you develop constipation, eat more high-fiber foods, such as fresh vegetables or fruit and whole grains. Drink enough fluids to keep your urine clear or pale yellow. Activity and Exercise  Exercise only as directed by your health care provider. Exercising will help you:  Control your weight.  Stay in shape.  Be prepared for labor and delivery.  Experiencing pain or cramping in the lower abdomen or low back is a good sign that you should stop exercising. Check with your health care provider before continuing normal exercises.  Try to avoid standing for long periods of time. Move your legs often if you must stand in one place for a long time.  Avoid heavy lifting.  Wear low-heeled shoes, and practice good posture.  You may continue to have sex unless your health care provider directs you  otherwise. Relief of Pain or Discomfort  Wear a good support bra for breast tenderness.   Take warm sitz baths to soothe any pain or discomfort caused by hemorrhoids. Use hemorrhoid cream if your health care provider approves.   Rest with your legs elevated if you have leg cramps or low back pain.  If you develop varicose veins in your legs, wear support hose. Elevate your feet for 15 minutes, 3-4 times a day. Limit salt in your diet. Prenatal Care  Schedule your prenatal visits by the twelfth week of pregnancy. They are usually scheduled monthly at first, then more often in the last 2 months before delivery.  Write down your questions. Take them to your prenatal visits.  Keep all your prenatal visits as directed by your health care provider. Safety  Wear your seat belt at all times when driving.  Make a list of emergency phone numbers, including numbers for family, friends, the hospital, and police and fire departments. General Tips    Ask your health care provider for a referral to a local prenatal education class. Begin classes no later than at the beginning of month 6 of your pregnancy.  Ask for help if you have counseling or nutritional needs during pregnancy. Your health care provider can offer advice or refer you to specialists for help with various needs.  Do not use hot tubs, steam rooms, or saunas.  Do not douche or use tampons or scented sanitary pads.  Do not cross your legs for long periods of time.  Avoid cat litter boxes and soil used by cats. These carry germs that can cause birth defects in the baby and possibly loss of the fetus by miscarriage or stillbirth.  Avoid all smoking, herbs, alcohol, and medicines not prescribed by your health care provider. Chemicals in these affect the formation and growth of the baby.  Schedule a dentist appointment. At home, brush your teeth with a soft toothbrush and be gentle when you floss. SEEK MEDICAL CARE IF:   You have  dizziness.  You have mild pelvic cramps, pelvic pressure, or nagging pain in the abdominal area.  You have persistent nausea, vomiting, or diarrhea.  You have a bad smelling vaginal discharge.  You have pain with urination.  You notice increased swelling in your face, hands, legs, or ankles. SEEK IMMEDIATE MEDICAL CARE IF:   You have a fever.  You are leaking fluid from your vagina.  You have spotting or bleeding from your vagina.  You have severe abdominal cramping or pain.  You have rapid weight gain or loss.  You vomit blood or material that looks like coffee grounds.  You are exposed to German measles and have never had them.  You are exposed to fifth disease or chickenpox.  You develop a severe headache.  You have shortness of breath.  You have any kind of trauma, such as from a fall or a car accident. Document Released: 10/01/2001 Document Revised: 02/21/2014 Document Reviewed: 08/17/2013 Exit[employer] Patient Information 2015 [employer]. This information is not intended to replace advice given to you by your health care provider. Make sure you discuss any questions you have with your health care provider.  

## 2015-05-17 NOTE — MAU Note (Signed)
Pt stated she has been having episodes of dizziness at work. Vomited once and then has been having diarrhea since this morning. Pt c/o abd cramping as well.

## 2015-05-17 NOTE — MAU Note (Signed)
Pt. Stated to provider upon initial assessment that she feels sad and depressed.  Stated h/o mental health issues, and now feels increasingly depressed due to recent pregnancy.  Is agreeable to talking to mental health provider and agrees it would be beneficial for her to discuss her feelings.

## 2015-05-17 NOTE — BH Assessment (Signed)
TTS attempted to completed assessment; however pt is unavailable due to having an ultrasound per nurse. Pt nurse will contact counselor when pt is ready for her assessment.

## 2015-05-17 NOTE — MAU Note (Signed)
Had telepsych assessment performed by [place].  Pt expressed an agreement to be admitted to behavioral health if that is what is decided.  She verbalized wanting to be healthy so that she can take care of her kids.

## 2015-05-17 NOTE — MAU Provider Note (Signed)
History     CSN: [id number]  Arrival date and time: 05/17/15 1403   First Provider Initiated Contact with Patient 05/17/15 1838      Chief Complaint  Patient presents with  . Dizziness  . Diarrhea   HPI Kimberly Rosales 29 y.o. [id number] @[redacted]w[redacted]d  presents with nausea, vomiting and diarrhea. She has vomited twice today and diarrhea 4 times.  She was able to eat a full meal from [employer] since being here and is able to keep it down.  She denies vaginal bleeding, dizziness, weakness, dysuria.  Dizziness is only with prolonged standing.   On review of medications, she admits to discontinuation of psych med at 6weeks pregnancy and since then is having significant symptoms including suicidal ideation.  She stopped her meds on her own because she thought that best for baby.  She reports using psych meds for severe depression, anxiety and PTSD.   OB History    Gravida Para Term Preterm AB TAB SAB Ectopic Multiple Living   6 3 3  2  2   3       Past Medical History  Diagnosis Date  . Anxiety   . Mental disorder   . Depression   . Alcoholism in remission   . Asthma     Past Surgical History  Procedure Laterality Date  . Cesarean section    . Nasal reconstruction    . Wrist surgery      nerve and tendon repair    History reviewed. No pertinent family history.  History  Substance Use Topics  . Smoking status: Current Every Day Smoker -- 0.50 packs/day    Types: Cigarettes  . Smokeless tobacco: Not on file  . Alcohol Use: No    Allergies: No Known Allergies  Prescriptions prior to admission  Medication Sig Dispense Refill Last Dose  . DULoxetine (CYMBALTA) 60 MG capsule Take 1 capsule (60 mg total) by mouth daily. For depression (Patient not taking: Reported on 04/19/2015) 30 capsule 0   . escitalopram (LEXAPRO) 20 MG tablet Take 20 mg by mouth daily.   04/19/2015 at Unknown time  . gabapentin (NEURONTIN) 300 MG capsule Take 1 capsule (300 mg total) by mouth 3 (three) times daily.  For anxiety issues (Patient not taking: Reported on 04/19/2015) 90 capsule 0   . lamoTRIgine (LAMICTAL) 25 MG tablet Take 1 tablet (25 mg total) by mouth daily. For mood stabilization (Patient not taking: Reported on 04/19/2015) 30 tablet 0   . lidocaine (LIDODERM) 5 % Place 1 patch onto the skin daily. Remove & Discard patch within 12 hours or as directed by MD: For pain management (Patient not taking: Reported on 04/19/2015) 5 patch 0   . QUEtiapine (SEROQUEL) 50 MG tablet Take 1 tablet (50 mg total) by mouth 3 (three) times daily. For mood control/anxiety (Patient not taking: Reported on 04/19/2015) 90 tablet 0   . ramelteon (ROZEREM) 8 MG tablet Take 1 tablet (8 mg total) by mouth at bedtime. For sleep (Patient not taking: Reported on 04/19/2015) 30 tablet 0   . topiramate (TOPAMAX) 25 MG tablet Take 1 tablet (25 mg total) by mouth 2 (two) times daily. For mood stabilization (Patient not taking: Reported on 04/19/2015) 60 tablet 0   . traZODone (DESYREL) 100 MG tablet Take 1 tablet (100 mg total) by mouth at bedtime as needed and may repeat dose one time if needed for sleep. (Patient not taking: Reported on 04/19/2015) 60 tablet 0   . ziprasidone (GEODON) 60 MG  capsule Take 60 mg by mouth daily.   04/19/2015 at Unknown time    ROS Pertinent ROS in HPI.  All other systems are negative.   Physical Exam   Blood pressure 138/69, pulse 95, temperature 98.8 F (37.1 C), temperature source Oral, resp. rate 18, height  (1.575 m), weight 155 lb 9.6 oz (70.58 kg), last menstrual period 03/18/2015.  Physical Exam  Constitutional: She is oriented to person, place, and time. She appears well-developed and well-nourished. No distress.  HENT:  Head: Normocephalic and atraumatic.  Eyes: EOM are normal.  Neck: Normal range of motion.  Cardiovascular: Normal rate.   Respiratory: Effort normal and breath sounds normal. No respiratory distress.  GI: Soft. Bowel sounds are normal. She exhibits no distension.  There is no tenderness. There is no rebound and no guarding.  Musculoskeletal: Normal range of motion.  Neurological: She is alert and oriented to person, place, and time.  Skin: Skin is warm and dry.  Psychiatric: She has a normal mood and affect.    MAU Course  Procedures  MDM Reglan ordered to address nausea.   CBC confirms no white count and hemodynamically stable.  U/A shows no dehydration or infection.  Pt admits real reason for coming is concerns over mental state.  She cannot care for her kids.  Cannot be alone and safe.   TTS consult ordered.   U/S ordered to confirm viability.    Report given and care turned over to Thressa Sheller, CNM.   2249: [facility] did a telepsych eval. They are recommending outpatient treatment with a safety contract. Will fax info and contract for patient to sign.   2309: Patient agrees with and has signed the no harm contract  Assessment and Plan   1. Suicidal ideations   2. Confirm fetal viability with history of miscarriage, ultrasound   3. Nausea/vomiting in pregnancy    DC home Comfort measures reviewed  1st/2nd/3rd Trimester precautions  No Self Harm contract signed  RX: phenergan #[id number]  Return to MAU as needed FU with OB as planned  Follow-up Information    Schedule an appointment as soon as possible for a visit with [facility] [place].   Contact information:   [address] [facility] [place] [address] [phone]      Tawnya Crook 11:12 PM 05/17/2015   Glyn Ade, Scot Jun 05/17/2015, 6:41 PM

## 2015-05-17 NOTE — BH Assessment (Signed)
Assessment completed. Consulted Donell Sievert, PA-C who recommended that pt be provided with outpatient resources and sign a Engineer[employer] systems. Pt should return to hospital if her symptoms worsen. Heather, CNM has been informed of the recommendation. A safety contract and outpatient resources have been faxed over.
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
## 2015-05-17 NOTE — MAU Note (Signed)
Not in Lobby x1 

## 2016-02-22 ENCOUNTER — Encounter (HOSPITAL_COMMUNITY): Payer: Self-pay | Admitting: *Deleted

## 2016-04-02 IMAGING — US US OB COMP LESS 14 WK
1 series · 14 of 22 positions shown · non-contrast
Comparison: 04/19/2015

CLINICAL DATA: Cramping and dizziness

EXAM:
OBSTETRIC <14 WK US AND TRANSVAGINAL OB US
TECHNIQUE: Both transabdominal and transvaginal ultrasound examinations were
performed for complete evaluation of the gestation as well as the
maternal uterus, adnexal regions, and pelvic cul-de-sac.
Transvaginal technique was performed to assess early pregnancy.

[Series 1: us ob transvaginal · 14 of 22 slices shown]
[im 1/22]
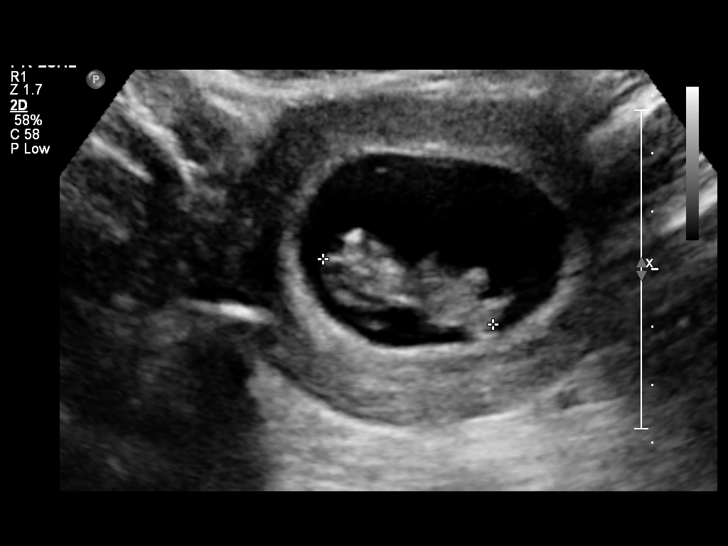
[im 3/22]
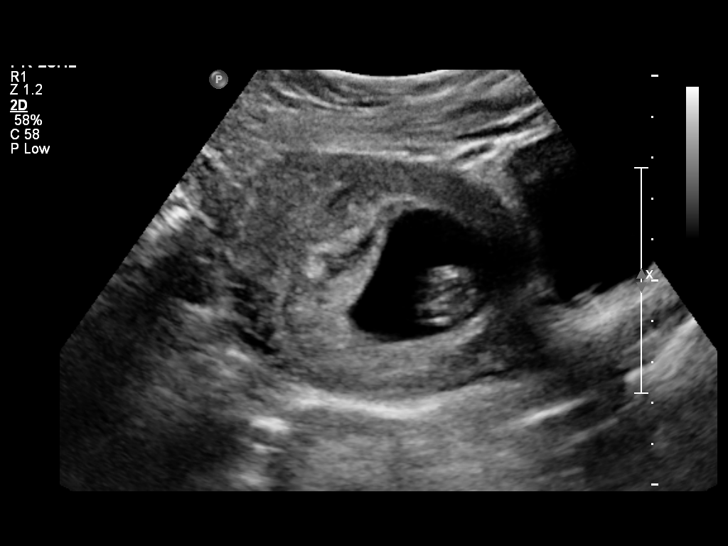
[im 4/22]
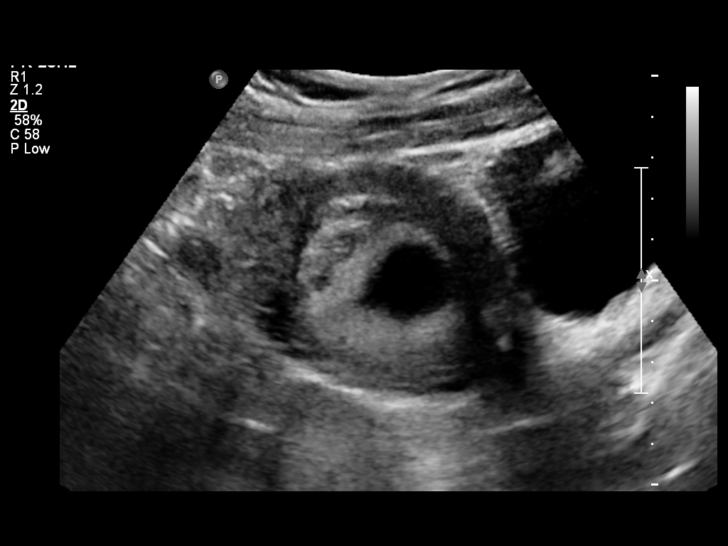
[im 6/22]
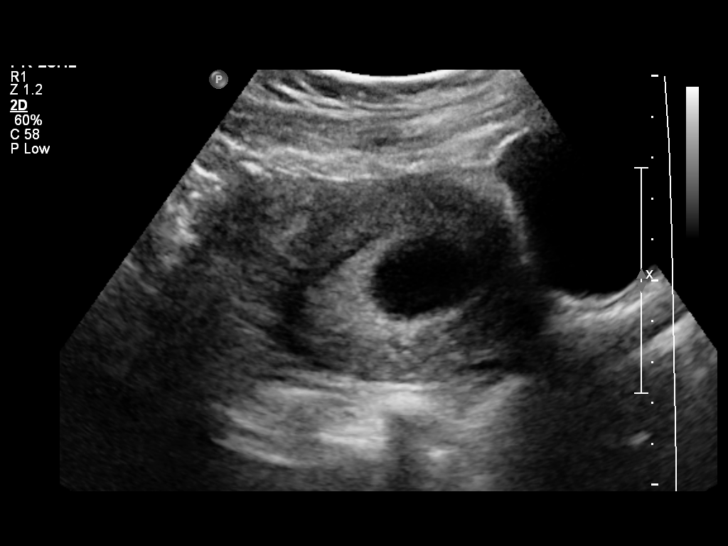
[im 8/22]
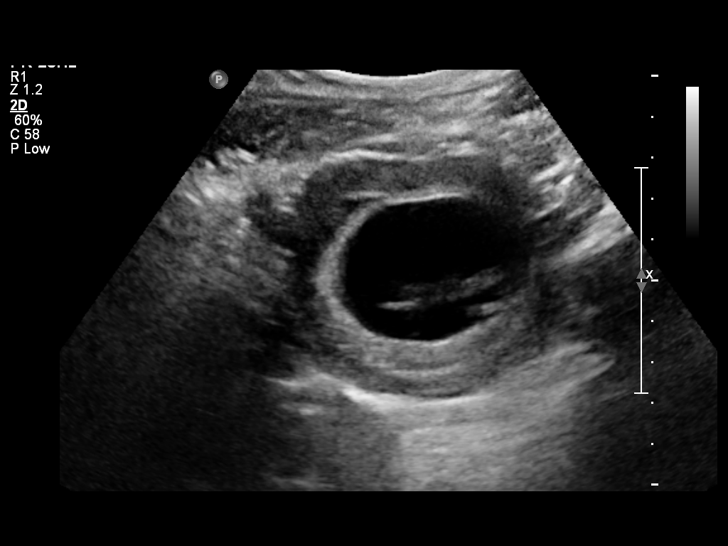
[im 9/22]
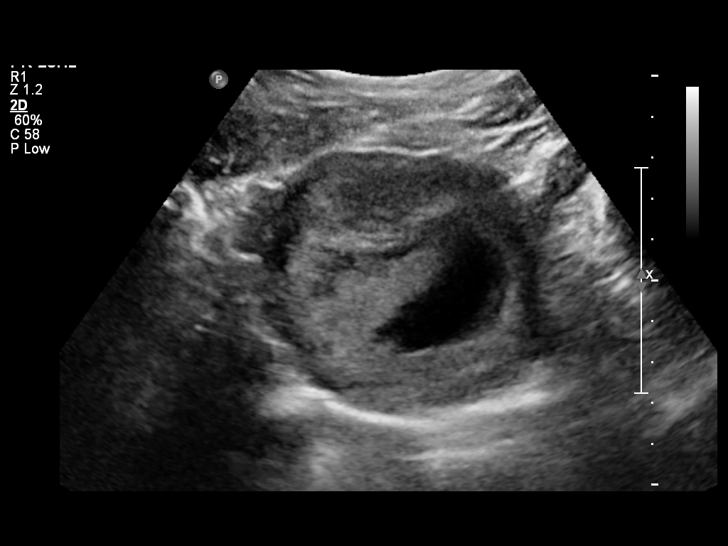
[im 11/22]
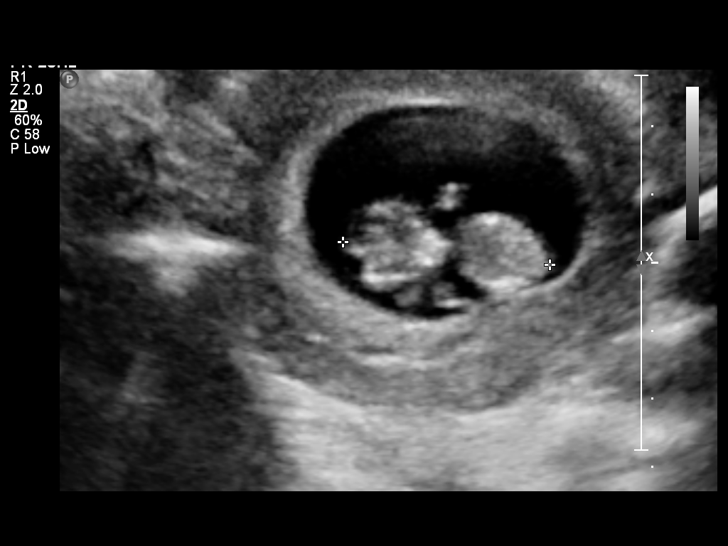
[im 12/22]
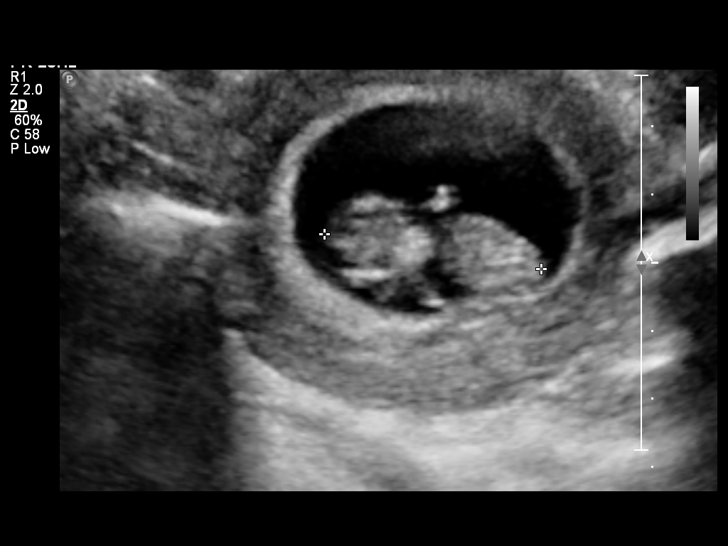
[im 14/22]
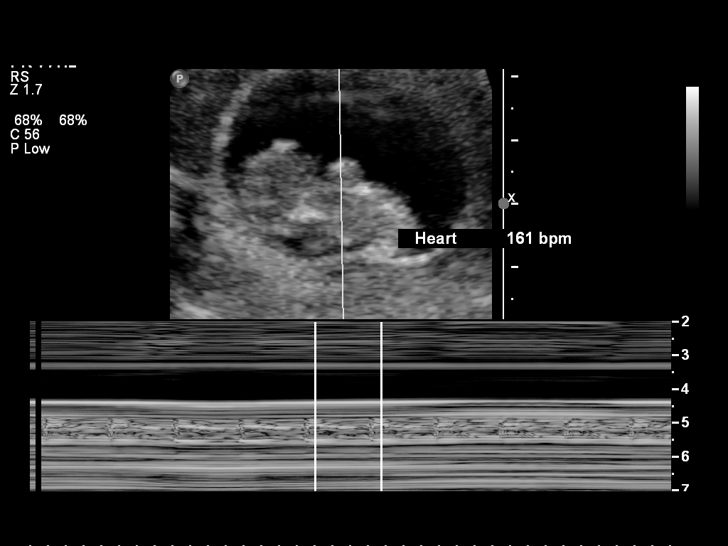
[im 15/22]
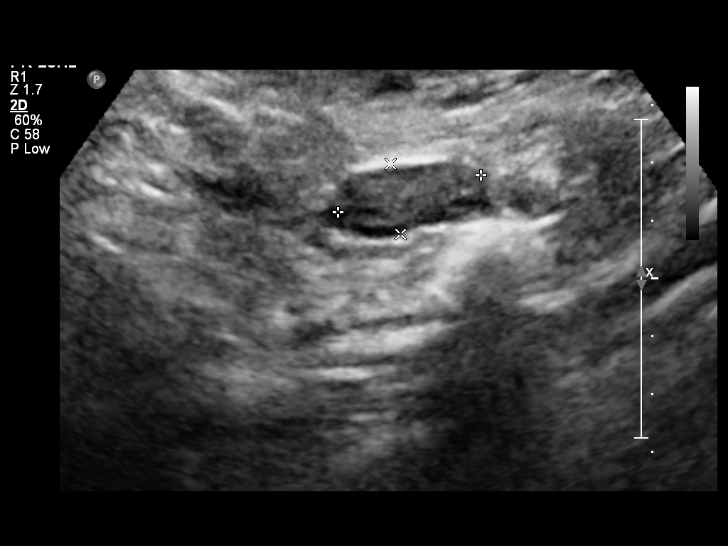
[im 17/22]
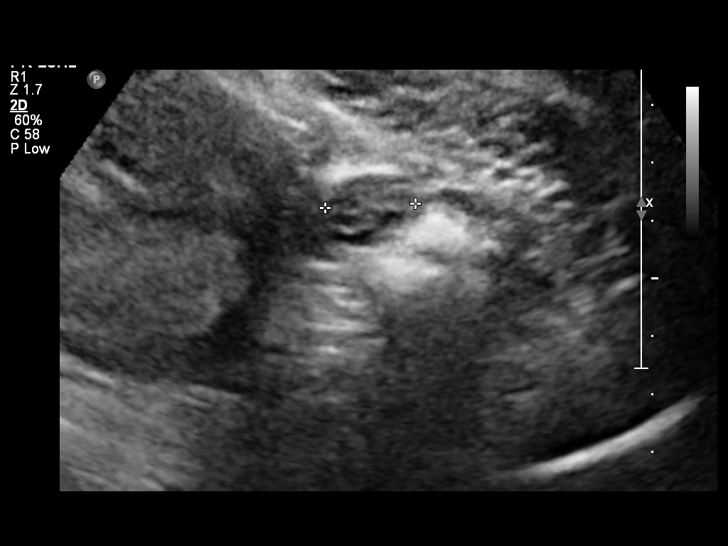
[im 19/22]
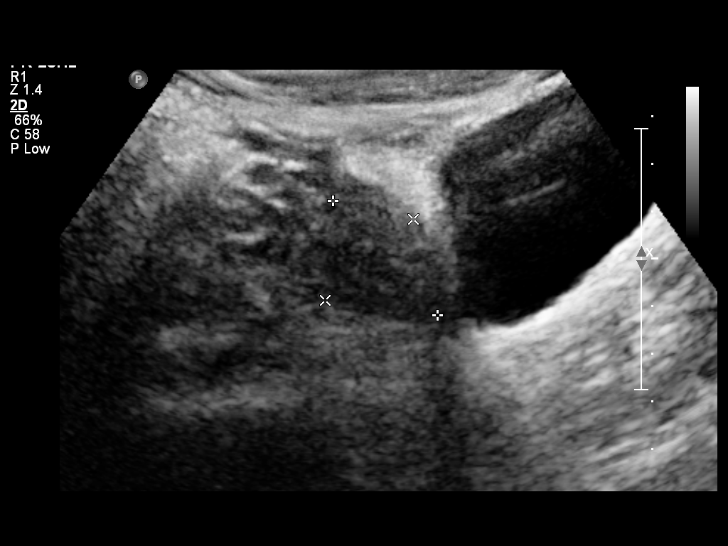
[im 20/22]
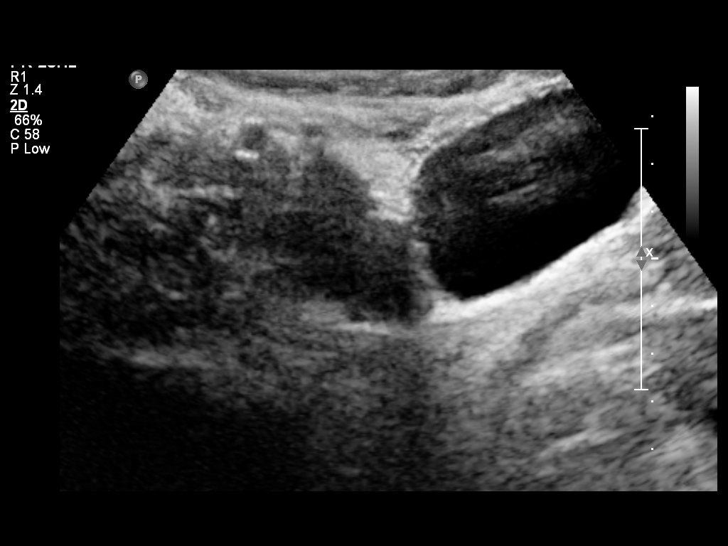
[im 22/22]
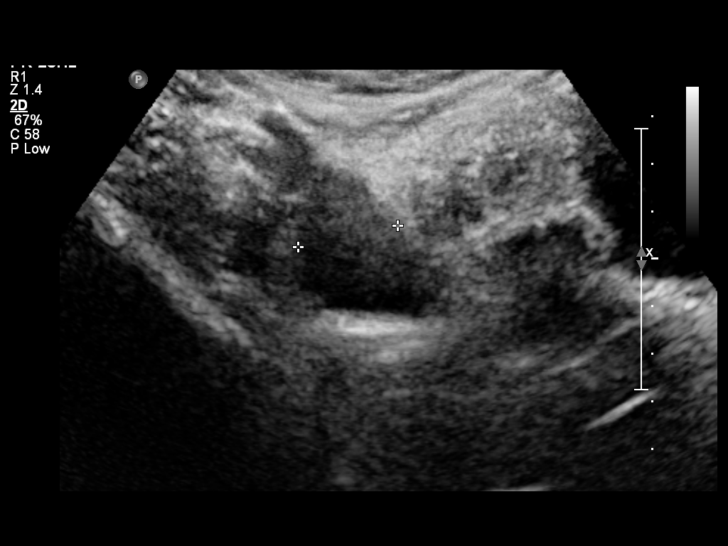

[14 of 22 positions shown; findings below may reference images not displayed]

FINDINGS: Intrauterine gestational sac: Single

Yolk sac:  No

Embryo:  Yes

Cardiac Activity: Yes

Heart Rate: 161  bpm

CRL:  3.13  mm   10 w   1 d                  US EDC: 12/12/2015

Maternal uterus/adnexae:

Subchorionic hemorrhage: None

Right ovary: Normal

Left ovary: Normal

Other :None

Free fluid:  None
IMPRESSION: 1. Single living intrauterine gestation.
2. The estimated gestational age is 10 weeks and 1 day. No
complications.

## 2017-01-27 ENCOUNTER — Encounter: Payer: Self-pay | Admitting: Gastroenterology

## 2017-02-07 ENCOUNTER — Encounter: Payer: Self-pay | Admitting: Gastroenterology

## 2017-02-07 ENCOUNTER — Telehealth: Payer: Self-pay | Admitting: Gastroenterology

## 2017-02-07 ENCOUNTER — Ambulatory Visit: Payer: Medicaid Other | Admitting: Gastroenterology

## 2017-02-07 NOTE — Telephone Encounter (Signed)
PT WAS A NO SHOW AND LETTER SENT  °

## 2017-05-08 ENCOUNTER — Ambulatory Visit: Payer: Medicaid Other | Admitting: Neurology

## 2017-05-08 ENCOUNTER — Telehealth: Payer: Self-pay | Admitting: *Deleted

## 2017-05-08 NOTE — Telephone Encounter (Signed)
No showed new patient appointment. 

## 2017-05-09 ENCOUNTER — Encounter: Payer: Self-pay | Admitting: Neurology

## 2020-04-08 ENCOUNTER — Encounter (HOSPITAL_COMMUNITY): Payer: Self-pay

## 2020-04-08 DIAGNOSIS — A599 Trichomoniasis, unspecified: Secondary | ICD-10-CM | POA: Diagnosis not present

## 2020-04-08 DIAGNOSIS — F17210 Nicotine dependence, cigarettes, uncomplicated: Secondary | ICD-10-CM | POA: Diagnosis not present

## 2020-04-08 DIAGNOSIS — J45909 Unspecified asthma, uncomplicated: Secondary | ICD-10-CM | POA: Diagnosis not present

## 2020-04-08 DIAGNOSIS — R350 Frequency of micturition: Secondary | ICD-10-CM | POA: Diagnosis present

## 2020-04-08 NOTE — ED Triage Notes (Signed)
Arrived POV. Patient reports she was seen at a [facility] outpatient clinic and checked for STD's. Patient states that she specifically wants to know if she has BV, but was told they did not check her for that yesterday.

## 2020-04-09 ENCOUNTER — Emergency Department (HOSPITAL_COMMUNITY)
Admission: EM | Admit: 2020-04-09 | Discharge: 2020-04-09 | Disposition: A | Discharge status: Home / Self Care | Payer: Medicaid Other | Attending: Emergency Medicine | Admitting: Emergency Medicine

## 2020-04-09 ENCOUNTER — Other Ambulatory Visit: Payer: Self-pay

## 2020-04-09 DIAGNOSIS — A599 Trichomoniasis, unspecified: Secondary | ICD-10-CM

## 2020-04-09 LAB — WET PREP, GENITAL
Clue Cells Wet Prep HPF POC: NONE SEEN
Sperm: NONE SEEN
Yeast Wet Prep HPF POC: NONE SEEN

## 2020-04-09 LAB — URINALYSIS, ROUTINE W REFLEX MICROSCOPIC
Bacteria, UA: NONE SEEN
Bilirubin Urine: NEGATIVE
Glucose, UA: NEGATIVE mg/dL
Ketones, ur: 5 mg/dL — AB
Leukocytes,Ua: NEGATIVE
Nitrite: NEGATIVE
Protein, ur: NEGATIVE mg/dL
Specific Gravity, Urine: 1.016 (ref 1.005–1.030)
pH: 6 (ref 5.0–8.0)

## 2020-04-09 MED ORDER — METRONIDAZOLE 500 MG PO TABS
2000.0000 mg | ORAL_TABLET | Freq: Once | ORAL | Status: AC
  Administered 2020-04-09: 2000 mg via ORAL
  Filled 2020-04-09: qty 4

## 2020-04-09 NOTE — Discharge Instructions (Signed)
Your testing was negative for BV and yeast today, however was positive for trichomonas. Your urine did not show any signs of infection. You were treated for trichomonas today in the ED, you do not need further treatment.  However you will need to let any sexual partners know that may need to get treated. Follow-up with your primary care doctor as needed for further evaluation. Return to the emergency room with any new, worsening, or concerning symptoms.

## 2020-04-09 NOTE — ED Provider Notes (Signed)
[place] DEPT Provider Note   CSN: [id number] Arrival date & time: 04/08/20  1900     History Chief Complaint  Patient presents with  . STD check    Kimberly Rosales is a 34 y.o. female presenting for evaluation of vaginal irritation.  Patient states that the past 2 days, she has had irritation immediately after urinating.  She also reports urinary frequency.  She was tested at an urgent care yesterday, but learned she was tested only for gonorrhea and chlamydia.  She is requesting testing for UTI as well as BV.  She denies vaginal discharge.  She states she douches daily.  She initially states she is not sexually active, but then states she has had unprotected sex with a new partner 2 weeks ago.  Reports a hysterectomy 3 years ago.  She denies fevers, chills, nausea, vomiting, abdominal pain.  She denies hematuria or dysuria.  She has not taken anything for her symptoms.  HPI     Past Medical History:  Diagnosis Date  . Alcoholism in remission ([place])   . Anxiety   . Asthma   . Depression   . Mental disorder     Patient Active Problem List   Diagnosis Date Noted  . PTSD (post-traumatic stress disorder) 03/06/2013  . Unspecified episodic mood disorder 03/06/2013  . Alcohol abuse 03/06/2013    Past Surgical History:  Procedure Laterality Date  . CESAREAN SECTION    . NASAL RECONSTRUCTION    . WRIST SURGERY     nerve and tendon repair     OB History    Gravida  6   Para  3   Term  3   Preterm      AB  2   Living  3     SAB  2   TAB      Ectopic      Multiple      Live Births              No family history on file.  Social History   Tobacco Use  . Smoking status: Current Every Day Smoker    Packs/day: 0.50    Types: Cigarettes  . Smokeless tobacco: Never Used  Substance Use Topics  . Alcohol use: No  . Drug use: Yes    Types: Marijuana    Home Medications Prior to Admission medications   Medication  Sig Start Date End Date Taking? Authorizing Provider  DULoxetine (CYMBALTA) 60 MG capsule Take 1 capsule (60 mg total) by mouth daily. For depression Patient not taking: Reported on 04/19/2015 03/10/13   Lindell Spar I, NP  escitalopram (LEXAPRO) 20 MG tablet Take 20 mg by mouth daily.    [provider]  gabapentin (NEURONTIN) 300 MG capsule Take 1 capsule (300 mg total) by mouth 3 (three) times daily. For anxiety issues Patient not taking: Reported on 04/19/2015 03/10/13   Lindell Spar I, NP  lamoTRIgine (LAMICTAL) 25 MG tablet Take 1 tablet (25 mg total) by mouth daily. For mood stabilization Patient not taking: Reported on 04/19/2015 03/10/13   Lindell Spar I, NP  lidocaine (LIDODERM) 5 % Place 1 patch onto the skin daily. Remove & Discard patch within 12 hours or as directed by MD: For pain management Patient not taking: Reported on 04/19/2015 03/10/13   Lindell Spar I, NP  prenatal vitamin w/FE, FA (PRENATAL 1 + 1) 27-1 MG TABS tablet Take 1 tablet by mouth daily at 12 noon.  [provider]  promethazine (PHENERGAN) 25 MG tablet Take 0.5-1 tablets (12.5-25 mg total) by mouth every 6 (six) hours as needed. 05/17/15   Armando Reichert, CNM  QUEtiapine (SEROQUEL) 50 MG tablet Take 1 tablet (50 mg total) by mouth 3 (three) times daily. For mood control/anxiety Patient not taking: Reported on 04/19/2015 03/10/13   Armandina Stammer I, NP  ramelteon (ROZEREM) 8 MG tablet Take 1 tablet (8 mg total) by mouth at bedtime. For sleep Patient not taking: Reported on 04/19/2015 03/10/13   Armandina Stammer I, NP  topiramate (TOPAMAX) 25 MG tablet Take 1 tablet (25 mg total) by mouth 2 (two) times daily. For mood stabilization Patient not taking: Reported on 04/19/2015 03/10/13   Armandina Stammer I, NP  traZODone (DESYREL) 100 MG tablet Take 1 tablet (100 mg total) by mouth at bedtime as needed and may repeat dose one time if needed for sleep. Patient not taking: Reported on 04/19/2015 03/10/13   Armandina Stammer I, NP    ziprasidone (GEODON) 60 MG capsule Take 60 mg by mouth daily.    [provider]    Allergies    Wellbutrin [bupropion]  Review of Systems   Review of Systems  Constitutional: Negative for fever.  Gastrointestinal: Negative for abdominal pain, nausea and vomiting.  Genitourinary: Positive for frequency.       Discomfort after urinating    Physical Exam Updated Vital Signs BP 122/66   Pulse 71   Temp 97.9 F (36.6 C)   Resp 17   Ht 5\' 2"  (1.575 m)   Wt 74.4 kg   SpO2 98%   BMI 30.00 kg/m   Physical Exam Vitals and nursing note reviewed. Exam conducted with a chaperone present.  Constitutional:      General: She is not in acute distress.    Appearance: She is well-developed.     Comments: Appears nontoxic  HENT:     Head: Normocephalic and atraumatic.  Pulmonary:     Effort: Pulmonary effort is normal.  Abdominal:     General: There is no distension.     Palpations: There is no mass.     Tenderness: There is no abdominal tenderness. There is no guarding or rebound.     Comments: No TTP of the abdomen  Genitourinary:    Adnexa: Right adnexa normal and left adnexa normal.       Right: No tenderness.         Left: No tenderness.       Comments: Combination of thick clumped white discharge of the vagina as well as thin white milky discharge.  No adnexal tenderness.  Uterus surgically absent. Musculoskeletal:        General: Normal range of motion.     Cervical back: Normal range of motion.  Skin:    General: Skin is warm.     Findings: No rash.  Neurological:     Mental Status: She is alert and oriented to person, place, and time.     ED Results / Procedures / Treatments   Labs (all labs ordered are listed, but only abnormal results are displayed) Labs Reviewed  WET PREP, GENITAL - Abnormal; Notable for the following components:      Result Value   Trich, Wet Prep PRESENT (*)    WBC, Wet Prep HPF POC RARE (*)    All other components within normal  limits  URINALYSIS, ROUTINE W REFLEX MICROSCOPIC - Abnormal; Notable for the following components:   Hgb  urine dipstick SMALL (*)    Ketones, ur 5 (*)    Non Squamous Epithelial 0-5 (*)    All other components within normal limits    EKG None  Radiology No results found.  Procedures Procedures (including critical care time)  Medications Ordered in ED Medications  metroNIDAZOLE (FLAGYL) tablet 2,000 mg (2,000 mg Oral Given 04/09/20 5732)    ED Course  I have reviewed the triage vital signs and the nursing notes.  Pertinent labs & imaging results that were available during my care of the patient were reviewed by me and considered in my medical decision making (see chart for details).    MDM Rules/Calculators/A&P                          Patient presenting for evaluation of discomfort after urination as well as urinary frequency.  On exam, patient appears nontoxic.  No tenderness palpation of the abdomen.  GU exam shows vaginal discharge, uterus is surgically absent.  Favor BV and/or yeast infection.  Wet prep pending. Also consider UTI, will obtain UA.  Urine without infection.  Wet prep positive for trichomoniasis, no yeast or clue cells.  Patient was tested for gonorrhea and chlamydia yesterday, will not repeat today.  Discussed findings with patient.  Discussed treatment in the ED with Flagyl.  Discussed importance of telling partner so that they can be treated.  At this time, patient appears safe for discharge.  Return precautions given.  Patient states she understands and agrees to plan.   Final Clinical Impression(s) / ED Diagnoses Final diagnoses:  Trichomoniasis    Rx / DC Orders ED Discharge Orders    None       Alveria Apley, PA-C 04/09/20 0349    Devoria Albe, MD 04/09/20 [phone]

## 2020-09-29 ENCOUNTER — Other Ambulatory Visit: Payer: Self-pay

## 2021-01-10 ENCOUNTER — Encounter (HOSPITAL_COMMUNITY): Payer: Self-pay

## 2021-01-10 ENCOUNTER — Other Ambulatory Visit: Payer: Self-pay

## 2021-01-10 ENCOUNTER — Ambulatory Visit (HOSPITAL_COMMUNITY)
Admission: EM | Admit: 2021-01-10 | Discharge: 2021-01-10 | Disposition: A | Discharge status: Home / Self Care | Payer: Medicaid Other | Attending: Emergency Medicine | Admitting: Emergency Medicine

## 2021-01-10 DIAGNOSIS — L519 Erythema multiforme, unspecified: Secondary | ICD-10-CM | POA: Diagnosis not present

## 2021-01-10 MED ORDER — METHYLPREDNISOLONE SODIUM SUCC 125 MG IJ SOLR
INTRAMUSCULAR | Status: AC
  Filled 2021-01-10: qty 2

## 2021-01-10 MED ORDER — NYSTATIN 100000 UNIT/ML MT SUSP: 5.0000 mL | Freq: Four times a day (QID) | OROMUCOSAL | 0 refills | Status: AC | PRN

## 2021-01-10 MED ORDER — METHYLPREDNISOLONE SODIUM SUCC 125 MG IJ SOLR
125.0000 mg | Freq: Once | INTRAMUSCULAR | Status: AC
  Administered 2021-01-10: 125 mg via INTRAMUSCULAR

## 2021-01-10 MED ORDER — PREDNISONE 10 MG (48) PO TBPK: ORAL_TABLET | ORAL | 0 refills | Status: AC

## 2021-01-10 NOTE — Discharge Instructions (Addendum)
Prednisone- take 6 for 2 days, 5 for 2 days, 4 for 2 days, 3 for 2 days, 2 for 2 days and then 1 for 2 days.   You can use the magic mouthwash as needed for mouth pain.    If the lesions in your mouth get worse or if they continue to spread, go directly to the emergency department.

## 2021-01-10 NOTE — ED Provider Notes (Addendum)
[place]   [id number] 01/10/21 Arrival Time: 1938  CC: RASH  SUBJECTIVE:  Kimberly Rosales is a 35 y.o. female who presents with a skin complaint that began 3 days ago.  Denies precipitating event or trauma.  Denies changes in soaps, detergents, close contacts with similar rash, known trigger or environmental trigger, allergy. Denies medications change or starting a new medication recently.  Rash initially started on right forearm but has since spread to bilateral arms, legs, and mouth  Describes it as itching.  Has tried steroid cream without relief.  Evaluated at another UC earlier today and given antibiotics and cream but reports symptoms have continued to worsen.  There are no specific aggravating or alleviating factors. Denies similar symptoms in the past.   Denies fever, chills, nausea, vomiting, erythema, swelling, discharge, oral lesions, SOB, chest pain, abdominal pain, changes in bowel or bladder function.    ROS: As per HPI.  All other pertinent ROS negative.     Past Medical History:  Diagnosis Date   Alcoholism in remission ([place])    Anxiety    Asthma    Depression    Mental disorder    Past Surgical History:  Procedure Laterality Date   CESAREAN SECTION     NASAL RECONSTRUCTION     WRIST SURGERY     nerve and tendon repair   Allergies  Allergen Reactions   Wellbutrin [Bupropion] Rash   No current facility-administered medications on file prior to encounter.   Current Outpatient Medications on File Prior to Encounter  Medication Sig Dispense Refill   DULoxetine (CYMBALTA) 60 MG capsule Take 1 capsule (60 mg total) by mouth daily. For depression (Patient not taking: Reported on 04/19/2015) 30 capsule 0   escitalopram (LEXAPRO) 20 MG tablet Take 20 mg by mouth daily.     gabapentin (NEURONTIN) 300 MG capsule Take 1 capsule (300 mg total) by mouth 3 (three) times daily. For anxiety issues (Patient not taking: Reported on 04/19/2015) 90 capsule 0    lamoTRIgine (LAMICTAL) 25 MG tablet Take 1 tablet (25 mg total) by mouth daily. For mood stabilization (Patient not taking: Reported on 04/19/2015) 30 tablet 0   lidocaine (LIDODERM) 5 % Place 1 patch onto the skin daily. Remove & Discard patch within 12 hours or as directed by MD: For pain management (Patient not taking: Reported on 04/19/2015) 5 patch 0   prenatal vitamin w/FE, FA (PRENATAL 1 + 1) 27-1 MG TABS tablet Take 1 tablet by mouth daily at 12 noon.     promethazine (PHENERGAN) 25 MG tablet Take 0.5-1 tablets (12.5-25 mg total) by mouth every 6 (six) hours as needed. 30 tablet 0   QUEtiapine (SEROQUEL) 50 MG tablet Take 1 tablet (50 mg total) by mouth 3 (three) times daily. For mood control/anxiety (Patient not taking: Reported on 04/19/2015) 90 tablet 0   ramelteon (ROZEREM) 8 MG tablet Take 1 tablet (8 mg total) by mouth at bedtime. For sleep (Patient not taking: Reported on 04/19/2015) 30 tablet 0   topiramate (TOPAMAX) 25 MG tablet Take 1 tablet (25 mg total) by mouth 2 (two) times daily. For mood stabilization (Patient not taking: Reported on 04/19/2015) 60 tablet 0   traZODone (DESYREL) 100 MG tablet Take 1 tablet (100 mg total) by mouth at bedtime as needed and may repeat dose one time if needed for sleep. (Patient not taking: Reported on 04/19/2015) 60 tablet 0   ziprasidone (GEODON) 60 MG capsule Take 60 mg by mouth daily.  Social History   Socioeconomic History   Marital status: Single    Spouse name: Not on file   Number of children: Not on file   Years of education: Not on file   Highest education level: Not on file  Occupational History   Not on file  Tobacco Use   Smoking status: Current Every Day Smoker    Packs/day: 0.50    Types: Cigarettes   Smokeless tobacco: Never Used  Substance and Sexual Activity   Alcohol use: No   Drug use: Yes    Types: Marijuana   Sexual activity: Yes  Other Topics Concern   Not on file  Social History Narrative    Not on file   Social Determinants of Health   Financial Resource Strain: Not on file  Food Insecurity: Not on file  Transportation Needs: Not on file  Physical Activity: Not on file  Stress: Not on file  Social Connections: Not on file  Intimate Partner Violence: Not on file   History reviewed. No pertinent family history.  OBJECTIVE: Vitals:   01/10/21 1949 01/10/21 1951 01/10/21 1953  BP:   105/63  Pulse: 94    Resp: 18 18   Temp: 97.9 F (36.6 C)    TempSrc: Oral    SpO2: 98% 98%     General appearance: alert; no distress Head: NCAT Lungs: clear to auscultation bilaterally Heart: regular rate and rhythm.  Radial pulse 2+ bilaterally Extremities: no edema Skin: warm and dry; (see picture below)          Psychological: alert and cooperative; normal mood and affect  ASSESSMENT & PLAN:  1. Erythema multiforme     Meds ordered this encounter  Medications   methylPREDNISolone sodium succinate (SOLU-MEDROL) 125 mg/2 mL injection 125 mg   predniSONE (STERAPRED UNI-PAK 48 TAB) 10 MG (48) TBPK tablet    Sig: Take as directed    Dispense:  48 tablet    Refill:  0    Order Specific Question:   Supervising Provider    Answer:   Chase Picket [[id number]]   magic mouthwash (nystatin, lidocaine, diphenhydrAMINE, alum & mag hydroxide) suspension    Sig: Swish and spit 5 mLs 4 (four) times daily as needed for mouth pain.    Dispense:  180 mL    Refill:  0    Order Specific Question:   Supervising Provider    Answer:   Chase Picket [id number]     Solu-medrol IM given in office.   Steroid taper prescribed and magic mouthwash for comfort.  Take as prescribed and to completion Instructed patient to go to the ED if symptoms do not improve or worsen.    Follow up with PCP if symptoms persists Return or go to the ER if you have any new or worsening symptoms such as fever, chills, nausea, vomiting, redness, swelling, discharge, if symptoms do not improve with  medications  Reviewed expectations re: course of current medical issues. Questions answered. Outlined signs and symptoms indicating need for more acute intervention. Patient verbalized understanding. After Visit Summary given.   Pearson Forster, NP 01/10/21 2017    Pearson Forster, NP 01/11/21 0830

## 2021-01-10 NOTE — ED Triage Notes (Signed)
Pt  Reports a spider bite to the left hand and states she has seen spots spreading all over her body. Pt states she noticed bumps on and inside her mouth.

## 2021-05-17 ENCOUNTER — Emergency Department (HOSPITAL_COMMUNITY)
Admission: EM | Admit: 2021-05-17 | Discharge: 2021-05-18 | Disposition: A | Discharge status: Home / Self Care | Payer: Medicaid Other | Attending: Emergency Medicine | Admitting: Emergency Medicine

## 2021-05-17 ENCOUNTER — Encounter (HOSPITAL_COMMUNITY): Payer: Self-pay | Admitting: Emergency Medicine

## 2021-05-17 ENCOUNTER — Other Ambulatory Visit: Payer: Self-pay

## 2021-05-17 ENCOUNTER — Emergency Department (HOSPITAL_COMMUNITY): Payer: Medicaid Other

## 2021-05-17 DIAGNOSIS — U071 COVID-19: Secondary | ICD-10-CM | POA: Diagnosis not present

## 2021-05-17 DIAGNOSIS — R059 Cough, unspecified: Secondary | ICD-10-CM | POA: Diagnosis present

## 2021-05-17 DIAGNOSIS — F17210 Nicotine dependence, cigarettes, uncomplicated: Secondary | ICD-10-CM | POA: Insufficient documentation

## 2021-05-17 LAB — RESP PANEL BY RT-PCR (FLU A&B, COVID) ARPGX2
Influenza A by PCR: NEGATIVE
Influenza B by PCR: NEGATIVE
SARS Coronavirus 2 by RT PCR: POSITIVE — AB

## 2021-05-17 LAB — COMPREHENSIVE METABOLIC PANEL
ALT: 15 U/L (ref 0–44)
AST: 18 U/L (ref 15–41)
Albumin: 4.3 g/dL (ref 3.5–5.0)
Alkaline Phosphatase: 49 U/L (ref 38–126)
Anion gap: 10 (ref 5–15)
BUN: 7 mg/dL (ref 6–20)
CO2: 22 mmol/L (ref 22–32)
Calcium: 9.9 mg/dL (ref 8.9–10.3)
Chloride: 103 mmol/L (ref 98–111)
Creatinine, Ser: 0.82 mg/dL (ref 0.44–1.00)
GFR, Estimated: >60 mL/min (ref >60)
Glucose, Bld: 90 mg/dL (ref 70–99)
Potassium: 3.7 mmol/L (ref 3.5–5.1)
Sodium: 135 mmol/L (ref 135–145)
Total Bilirubin: 0.5 mg/dL (ref 0.3–1.2)
Total Protein: 7.9 g/dL (ref 6.5–8.1)

## 2021-05-17 LAB — CBC WITH DIFFERENTIAL/PLATELET
Abs Immature Granulocytes: 0.04 10*3/uL (ref 0.00–0.07)
Basophils Absolute: 0 10*3/uL (ref 0.0–0.1)
Basophils Relative: 0 %
Eosinophils Absolute: 0.1 10*3/uL (ref 0.0–0.5)
Eosinophils Relative: 1 %
HCT: 41.6 % (ref 36.0–46.0)
Hemoglobin: 14.4 g/dL (ref 12.0–15.0)
Immature Granulocytes: 1 %
Lymphocytes Relative: 4 %
Lymphs Abs: 0.3 10*3/uL — ABNORMAL LOW (ref 0.7–4.0)
MCH: 27.9 pg (ref 26.0–34.0)
MCHC: 34.6 g/dL (ref 30.0–36.0)
MCV: 80.5 fL (ref 80.0–100.0)
Monocytes Absolute: 0.9 10*3/uL (ref 0.1–1.0)
Monocytes Relative: 11 %
Neutro Abs: 6.5 10*3/uL (ref 1.7–7.7)
Neutrophils Relative %: 83 %
Platelets: 279 10*3/uL (ref 150–400)
RBC: 5.17 MIL/uL — ABNORMAL HIGH (ref 3.87–5.11)
RDW: 13 % (ref 11.5–15.5)
WBC: 7.9 10*3/uL (ref 4.0–10.5)
nRBC: 0 % (ref 0.0–0.2)

## 2021-05-17 LAB — LIPASE, BLOOD: Lipase: 23 U/L (ref 11–51)

## 2021-05-17 MED ORDER — IBUPROFEN 400 MG PO TABS
400.0000 mg | ORAL_TABLET | Freq: Once | ORAL | Status: AC | PRN
  Administered 2021-05-17: 400 mg via ORAL
  Filled 2021-05-17: qty 1

## 2021-05-17 NOTE — ED Provider Notes (Signed)
Emergency Medicine Provider Triage Evaluation Note  KEITH CANCIO , a 35 y.o. female  was evaluated in triage.  Pt complains of COVID symptoms. Vaccinated however not booster. 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 Here with HA, myalgias, cough, NBNB emesis. Unable to keep any liquids down. No urinary complaints  Review of Systems  Positive: Ha, myalgias, cough, emesis Negative: Fever, abd pain  Physical Exam  LMP 03/18/2015  Gen:   Awake, no distress   Resp:  Normal effort  MSK:   Moves extremities without difficulty  Neuro:  CN 2-12 grossly intact Other:    Medical Decision Making  Medically screening exam initiated at 6:39 PM.  Appropriate orders placed.  DENI BERTI was informed that the remainder of the evaluation will be completed by another provider, this initial triage assessment does not replace that evaluation, and the importance of remaining in the ED until their evaluation is complete.  HA, emesis, cough, myalgias  Work up started   [employer], [employer], PA-C 05/17/21 1841    Mancel Bale, MD 05/18/21 1409

## 2021-05-17 NOTE — ED Provider Notes (Signed)
[facility] EMERGENCY DEPARTMENT Provider Note   CSN: [id number] Arrival date & time: 05/17/21  1608     History Chief Complaint  Patient presents with   Headache    Kimberly Rosales is a 35 y.o. female.  Patient to ED with symptoms of generalized body aches, cough, congestion and headache. She has nausea with 3 episodes emesis today that were NBNB. She states the bulk of her symptoms have come on over the last 2-3 days, however, she reports some muscular aches as long as 2 weeks ago. She has a history of asthma but denies any wheezing or SOB currently.   The history is provided by the patient. No language interpreter was used.  Headache Associated symptoms: congestion, cough, fatigue, myalgias, nausea and vomiting   Associated symptoms: no abdominal pain and no diarrhea       Past Medical History:  Diagnosis Date   Alcoholism in remission ([facility])    Anxiety    Asthma    Depression    Mental disorder     Patient Active Problem List   Diagnosis Date Noted   PTSD (post-traumatic stress disorder) 03/06/2013   Unspecified episodic mood disorder 03/06/2013   Alcohol abuse 03/06/2013    Past Surgical History:  Procedure Laterality Date   CESAREAN SECTION     NASAL RECONSTRUCTION     WRIST SURGERY     nerve and tendon repair     OB History     Gravida  6   Para  3   Term  3   Preterm      AB  2   Living  3      SAB  2   IAB      Ectopic      Multiple      Live Births              No family history on file.  Social History   Tobacco Use   Smoking status: Every Day    Packs/day: 0.50    Types: Cigarettes   Smokeless tobacco: Never  Substance Use Topics   Alcohol use: No   Drug use: Yes    Types: Marijuana    Home Medications Prior to Admission medications   Medication Sig Start Date End Date Taking? Authorizing Provider  DULoxetine (CYMBALTA) 60 MG capsule Take 1 capsule (60 mg total) by mouth daily. For  depression Patient not taking: Reported on 04/19/2015 03/10/13   Armandina Stammer I, NP  escitalopram (LEXAPRO) 20 MG tablet Take 20 mg by mouth daily.    [provider]  gabapentin (NEURONTIN) 300 MG capsule Take 1 capsule (300 mg total) by mouth 3 (three) times daily. For anxiety issues Patient not taking: Reported on 04/19/2015 03/10/13   Armandina Stammer I, NP  lamoTRIgine (LAMICTAL) 25 MG tablet Take 1 tablet (25 mg total) by mouth daily. For mood stabilization Patient not taking: Reported on 04/19/2015 03/10/13   Armandina Stammer I, NP  lidocaine (LIDODERM) 5 % Place 1 patch onto the skin daily. Remove & Discard patch within 12 hours or as directed by MD: For pain management Patient not taking: Reported on 04/19/2015 03/10/13   Armandina Stammer I, NP  magic mouthwash (nystatin, lidocaine, diphenhydrAMINE, alum & mag hydroxide) suspension Swish and spit 5 mLs 4 (four) times daily as needed for mouth pain. 01/10/21   Ivette Loyal, NP  predniSONE (STERAPRED UNI-PAK 48 TAB) 10 MG (48) TBPK tablet Take as directed 01/10/21  Ivette Loyal, NP  prenatal vitamin w/FE, FA (PRENATAL 1 + 1) 27-1 MG TABS tablet Take 1 tablet by mouth daily at 12 noon.    [provider]  promethazine (PHENERGAN) 25 MG tablet Take 0.5-1 tablets (12.5-25 mg total) by mouth every 6 (six) hours as needed. 05/17/15   Armando Reichert, CNM  QUEtiapine (SEROQUEL) 50 MG tablet Take 1 tablet (50 mg total) by mouth 3 (three) times daily. For mood control/anxiety Patient not taking: Reported on 04/19/2015 03/10/13   Armandina Stammer I, NP  ramelteon (ROZEREM) 8 MG tablet Take 1 tablet (8 mg total) by mouth at bedtime. For sleep Patient not taking: Reported on 04/19/2015 03/10/13   Armandina Stammer I, NP  topiramate (TOPAMAX) 25 MG tablet Take 1 tablet (25 mg total) by mouth 2 (two) times daily. For mood stabilization Patient not taking: Reported on 04/19/2015 03/10/13   Armandina Stammer I, NP  traZODone (DESYREL) 100 MG tablet Take 1 tablet (100 mg  total) by mouth at bedtime as needed and may repeat dose one time if needed for sleep. Patient not taking: Reported on 04/19/2015 03/10/13   Armandina Stammer I, NP  ziprasidone (GEODON) 60 MG capsule Take 60 mg by mouth daily.    [provider]    Allergies    Wellbutrin [bupropion]  Review of Systems   Review of Systems  Constitutional:  Positive for activity change, chills and fatigue.  HENT:  Positive for congestion.   Respiratory:  Positive for cough. Negative for shortness of breath and wheezing.   Gastrointestinal:  Positive for nausea and vomiting. Negative for abdominal pain and diarrhea.  Musculoskeletal:  Positive for myalgias.  Skin:  Negative for rash.  Neurological:  Positive for headaches.   Physical Exam Updated Vital Signs BP 130/81 (BP Location: Left Arm)   Pulse 93   Temp 98.6 F (37 C) (Oral)   Resp (!) 22   LMP 03/18/2015   SpO2 98%   Physical Exam Vitals and nursing note reviewed.  Constitutional:      Appearance: She is well-developed.  HENT:     Head: Normocephalic.  Cardiovascular:     Rate and Rhythm: Normal rate and regular rhythm.     Heart sounds: No murmur heard. Pulmonary:     Effort: Pulmonary effort is normal.     Breath sounds: Normal breath sounds. No wheezing, rhonchi or rales.  Abdominal:     General: Bowel sounds are normal.     Palpations: Abdomen is soft.     Tenderness: There is no abdominal tenderness. There is no guarding or rebound.  Musculoskeletal:        General: Normal range of motion.     Cervical back: Normal range of motion and neck supple.  Skin:    General: Skin is warm and dry.  Neurological:     General: No focal deficit present.     Mental Status: She is alert and oriented to person, place, and time.     GCS: GCS eye subscore is 4. GCS verbal subscore is 5. GCS motor subscore is 6.     Cranial Nerves: No cranial nerve deficit.    ED Results / Procedures / Treatments   Labs (all labs ordered are  listed, but only abnormal results are displayed) Labs Reviewed  RESP PANEL BY RT-PCR (FLU A&B, COVID) ARPGX2 - Abnormal; Notable for the following components:      Result Value   SARS Coronavirus 2 by RT PCR POSITIVE (*)  All other components within normal limits  CBC WITH DIFFERENTIAL/PLATELET - Abnormal; Notable for the following components:   RBC 5.17 (*)    Lymphs Abs 0.3 (*)    All other components within normal limits  COMPREHENSIVE METABOLIC PANEL  LIPASE, BLOOD    EKG None  Radiology DG Chest 2 View  Result Date: 05/17/2021 CLINICAL DATA:  cough EXAM: CHEST - 2 VIEW COMPARISON:  None. FINDINGS: The cardiomediastinal silhouette is normal in contour. No pleural effusion. No pneumothorax. No acute pleuroparenchymal abnormality. Visualized abdomen is unremarkable. No acute osseous abnormality noted. IMPRESSION: No acute cardiopulmonary abnormality. Electronically Signed   By: Meda Klinefelter MD   On: 05/17/2021 20:02    Procedures Procedures   Medications Ordered in ED Medications  ibuprofen (ADVIL) tablet 400 mg (400 mg Oral Given 05/17/21 2158)    ED Course  I have reviewed the triage vital signs and the nursing notes.  Pertinent labs & imaging results that were available during my care of the patient were reviewed by me and considered in my medical decision making (see chart for details).    MDM Rules/Calculators/A&P                           Patient to eD with ss/sxs as per HPI.   She is overall well appearing. Nontoxic, awake, alert.   Labs reviewed. She is found to be COVID+. CXR clear. VSS, no hypoxia. Lungs are clear with full, non-labored air movement.   She is felt appropriate for discharge home. Discussed care at home, isolation recommendations.  Final Clinical Impression(s) / ED Diagnoses Final diagnoses:  None   COVID+  Rx / DC Orders ED Discharge Orders     None        Elpidio Anis, PA-C 05/18/21 0000    Melene Plan,  DO 05/18/21 0018

## 2021-05-17 NOTE — ED Triage Notes (Signed)
Pt c/o generalized body aches, cough, chills and headache x 2 weeks.

## 2021-05-18 MED ORDER — ONDANSETRON 4 MG PO TBDP
4.0000 mg | ORAL_TABLET | Freq: Once | ORAL | Status: AC
  Administered 2021-05-18: 4 mg via ORAL
  Filled 2021-05-18: qty 1

## 2021-05-18 MED ORDER — ONDANSETRON 4 MG PO TBDP: 4.0000 mg | ORAL_TABLET | Freq: Three times a day (TID) | ORAL | 0 refills | Status: AC | PRN

## 2021-05-18 NOTE — Discharge Instructions (Signed)
Take Tylenol and/or ibuprofen for muscle aches and if any fever develops. Push fluids to avoid dehydration.   You will need to isolate for the next 5 days, then wear a mask at all times when out of isolation.   If you develop any shortness of breath, or difficulty breathing, return to the emergency department for further evaluation.

## 2022-04-03 IMAGING — DX DG CHEST 2V
2 series · 2 of 2 positions shown · non-contrast
Comparison: None.

CLINICAL DATA: cough

EXAM:
CHEST - 2 VIEW

[w chest lat]
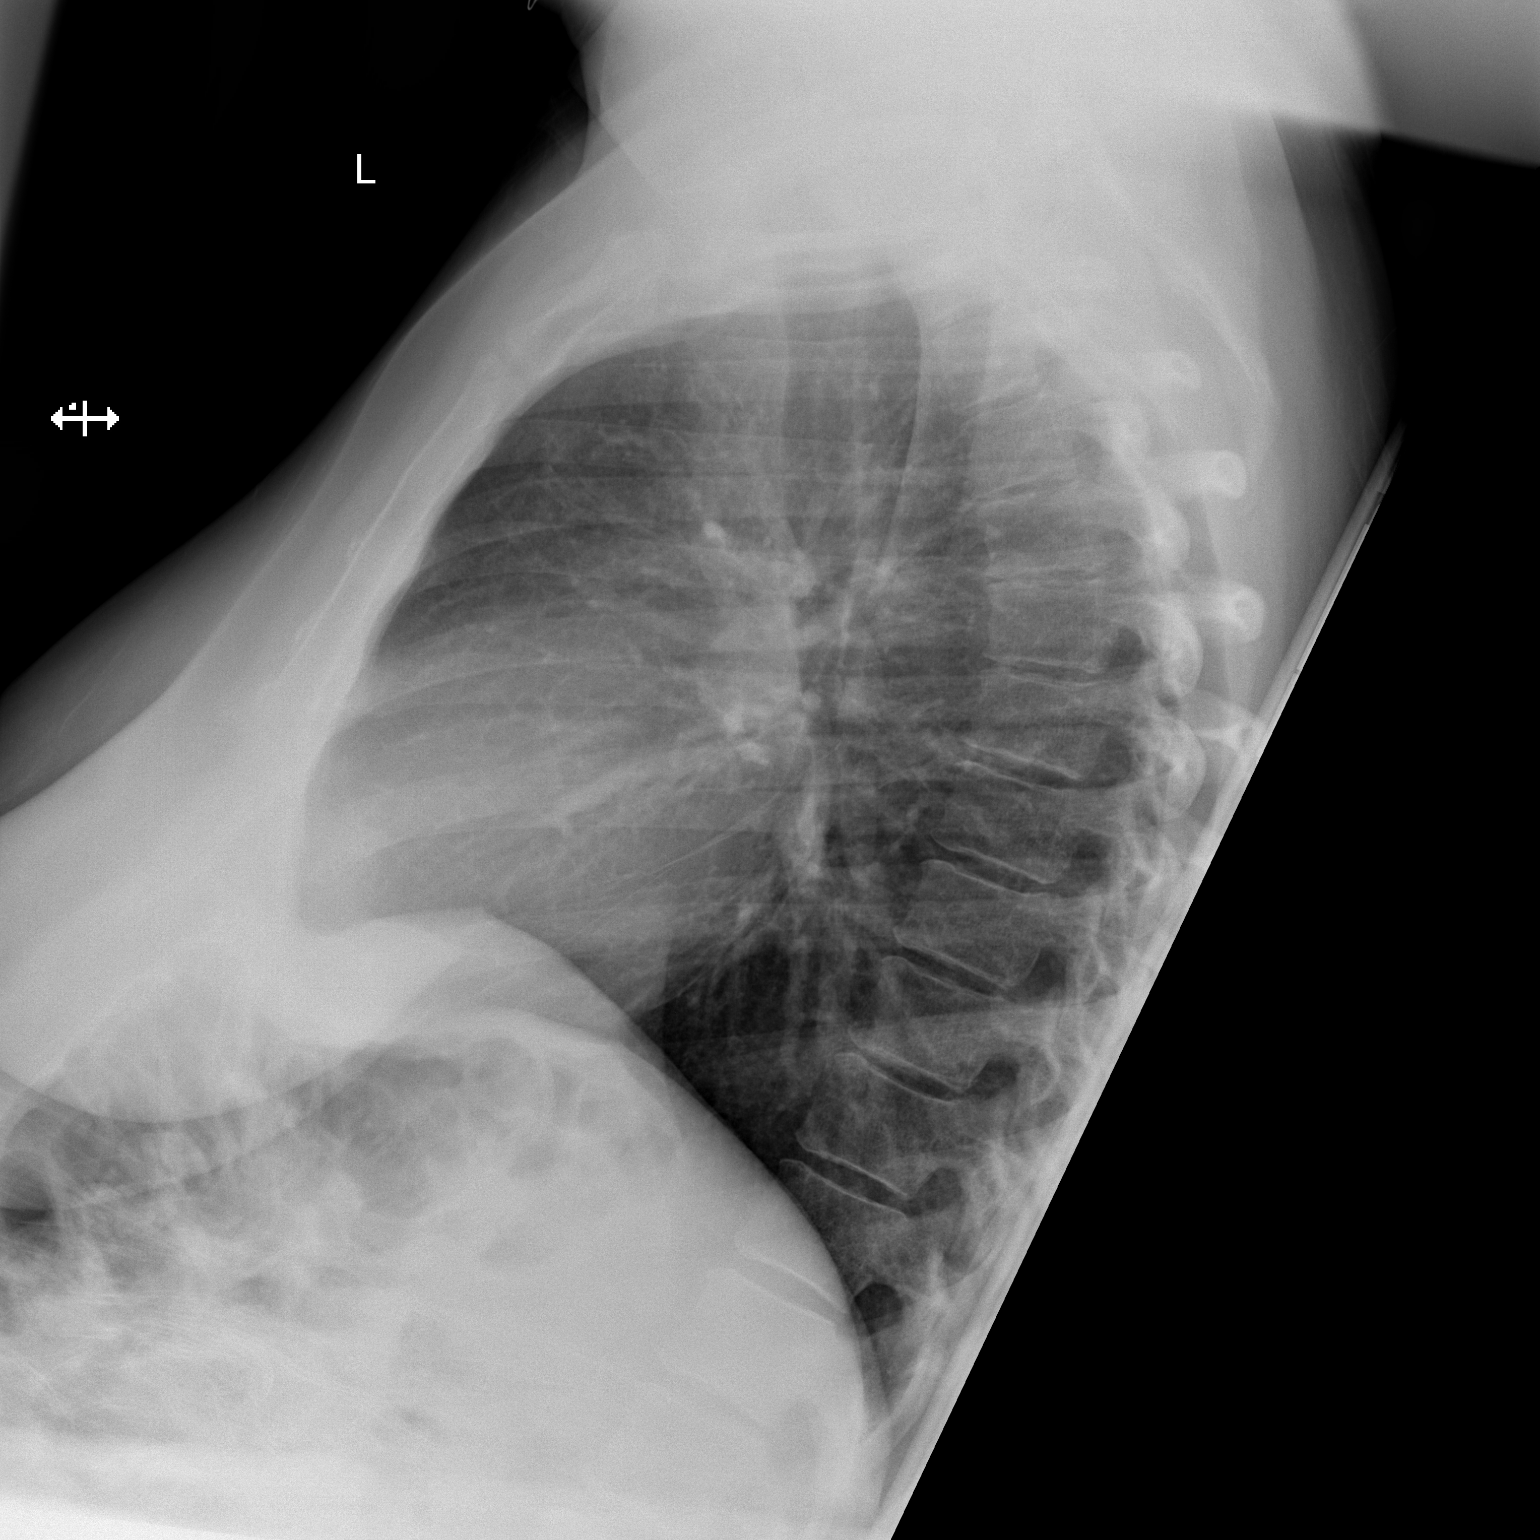

[x chest ap]
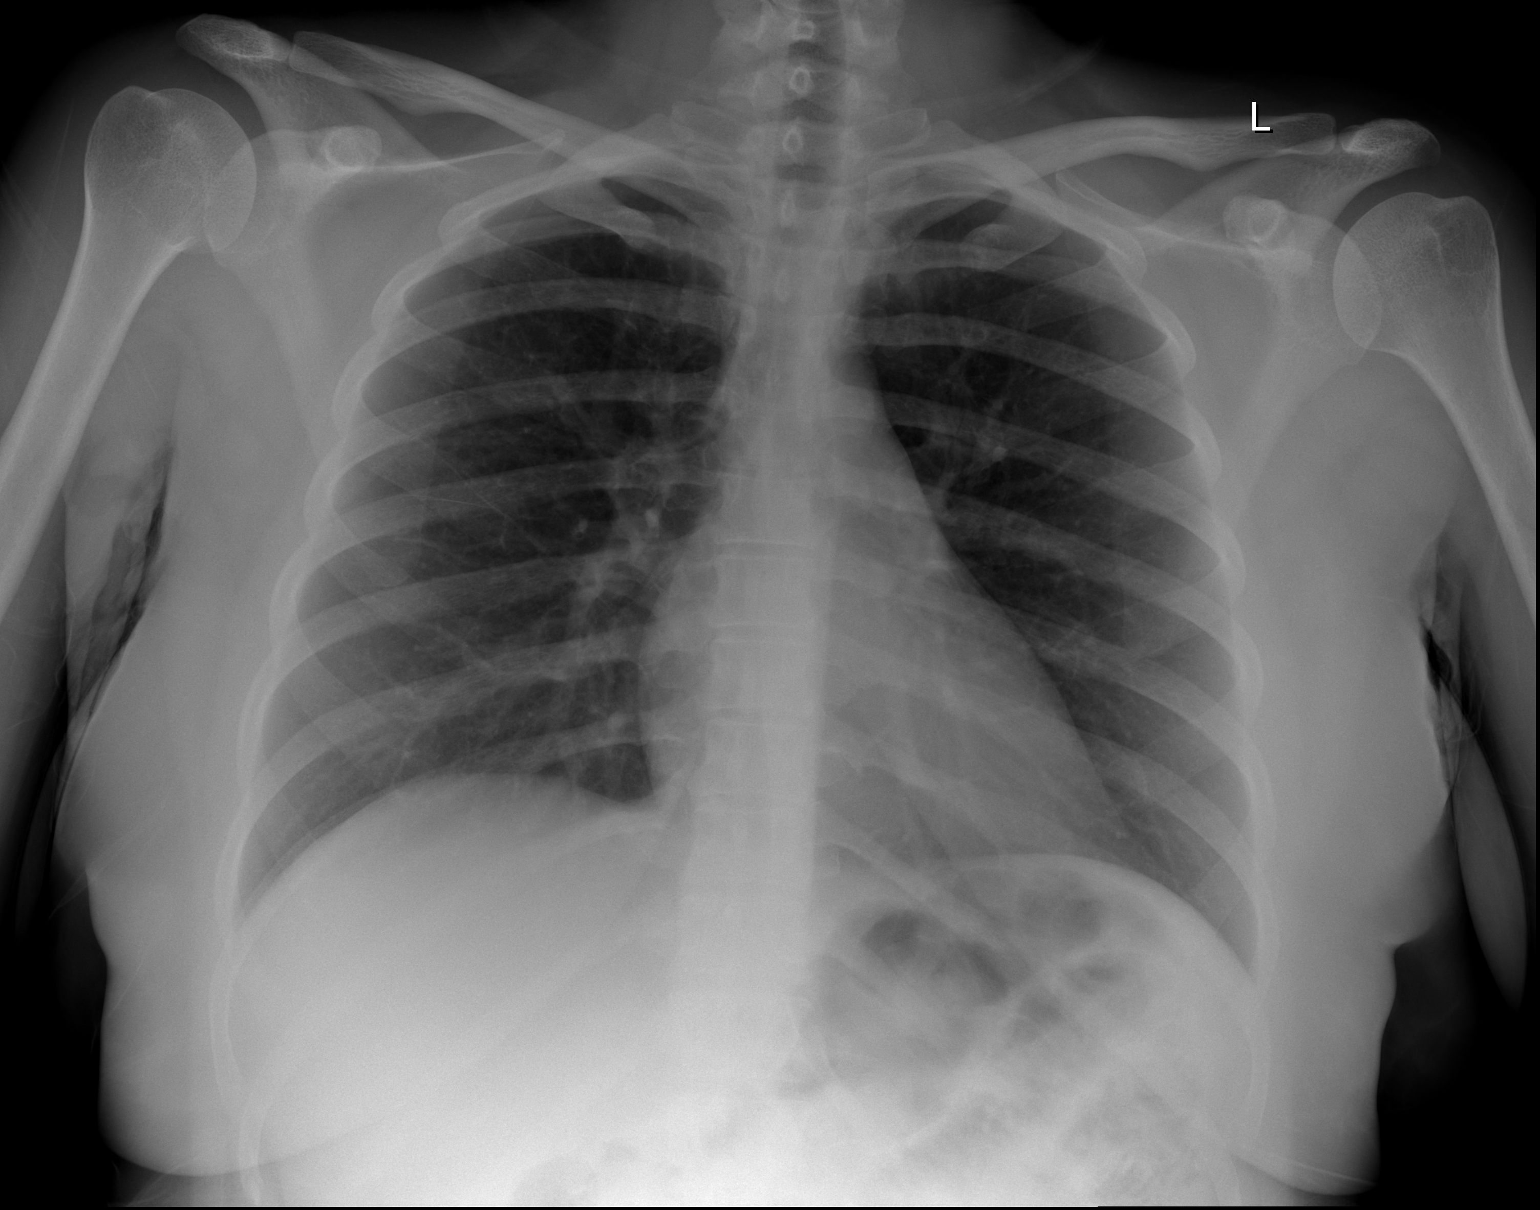

[2 of 2 positions shown; findings below may reference images not displayed]

FINDINGS: The cardiomediastinal silhouette is normal in contour. No pleural
effusion. No pneumothorax. No acute pleuroparenchymal abnormality.
Visualized abdomen is unremarkable. No acute osseous abnormality
noted.
IMPRESSION: No acute cardiopulmonary abnormality.
